# Patient Record
Sex: Female | Born: 1980 | Race: Black or African American | Hispanic: No | Marital: Married | Smoking: Never smoker
Health system: Southern US, Community
[De-identification: ages and names within clinical notes are randomized; demographics above are authoritative.]

## PROBLEM LIST (undated history)

## (undated) DIAGNOSIS — B181 Chronic viral hepatitis B without delta-agent: Secondary | ICD-10-CM

## (undated) DIAGNOSIS — O24419 Gestational diabetes mellitus in pregnancy, unspecified control: Secondary | ICD-10-CM

## (undated) DIAGNOSIS — D573 Sickle-cell trait: Secondary | ICD-10-CM

## (undated) HISTORY — DX: Gestational diabetes mellitus in pregnancy, unspecified control: O24.419

## (undated) HISTORY — DX: Chronic viral hepatitis B without delta-agent: B18.1

## (undated) HISTORY — DX: Sickle-cell trait: D57.3

---

## 2010-02-01 ENCOUNTER — Ambulatory Visit (HOSPITAL_COMMUNITY): Admission: RE | Admit: 2010-02-01 | Discharge: 2010-02-01 | Payer: Self-pay | Admitting: Obstetrics and Gynecology

## 2010-04-15 ENCOUNTER — Ambulatory Visit: Payer: Self-pay | Admitting: Obstetrics & Gynecology

## 2010-04-22 ENCOUNTER — Ambulatory Visit: Payer: Self-pay | Admitting: Obstetrics & Gynecology

## 2010-04-29 ENCOUNTER — Ambulatory Visit: Payer: Self-pay | Admitting: Family Medicine

## 2010-05-01 ENCOUNTER — Ambulatory Visit (HOSPITAL_COMMUNITY): Admission: RE | Admit: 2010-05-01 | Discharge: 2010-05-01 | Payer: Self-pay | Admitting: Family Medicine

## 2010-05-06 ENCOUNTER — Ambulatory Visit: Payer: Self-pay | Admitting: Obstetrics & Gynecology

## 2010-05-09 ENCOUNTER — Ambulatory Visit: Payer: Self-pay | Admitting: Obstetrics and Gynecology

## 2010-05-13 ENCOUNTER — Ambulatory Visit: Payer: Self-pay | Admitting: Obstetrics and Gynecology

## 2010-05-16 ENCOUNTER — Ambulatory Visit: Payer: Self-pay | Admitting: Obstetrics & Gynecology

## 2010-05-16 ENCOUNTER — Ambulatory Visit (HOSPITAL_COMMUNITY)
Admission: RE | Admit: 2010-05-16 | Discharge: 2010-05-16 | Payer: Self-pay | Source: Home / Self Care | Admitting: Family Medicine

## 2010-05-20 ENCOUNTER — Encounter: Payer: Self-pay | Admitting: Obstetrics & Gynecology

## 2010-05-20 ENCOUNTER — Ambulatory Visit: Payer: Self-pay | Admitting: Obstetrics & Gynecology

## 2010-05-20 LAB — CONVERTED CEMR LAB
ALT: 17 units/L (ref 0–35)
BUN: 5 mg/dL — ABNORMAL LOW (ref 6–23)
CO2: 21 meq/L (ref 19–32)
Creatinine, Ser: 0.68 mg/dL (ref 0.40–1.20)
Glucose, Bld: 116 mg/dL — ABNORMAL HIGH (ref 70–99)
HCT: 31.9 % — ABNORMAL LOW (ref 36.0–46.0)
MCV: 77.1 fL — ABNORMAL LOW (ref 78.0–100.0)
Platelets: 303 10*3/uL (ref 150–400)
TSH: 1.705 microintl units/mL (ref 0.350–4.500)
Total Bilirubin: 0.2 mg/dL — ABNORMAL LOW (ref 0.3–1.2)
WBC: 7.8 10*3/uL (ref 4.0–10.5)

## 2010-05-22 ENCOUNTER — Ambulatory Visit: Payer: Self-pay | Admitting: Obstetrics and Gynecology

## 2010-05-27 ENCOUNTER — Ambulatory Visit: Payer: Self-pay | Admitting: Obstetrics & Gynecology

## 2010-05-30 ENCOUNTER — Ambulatory Visit: Payer: Self-pay | Admitting: Obstetrics and Gynecology

## 2010-06-03 ENCOUNTER — Ambulatory Visit: Payer: Self-pay | Admitting: Family Medicine

## 2010-06-03 LAB — CONVERTED CEMR LAB
Chlamydia, DNA Probe: NEGATIVE
GC Probe Amp, Genital: NEGATIVE

## 2010-06-06 ENCOUNTER — Ambulatory Visit: Payer: Self-pay | Admitting: Obstetrics & Gynecology

## 2010-06-10 ENCOUNTER — Ambulatory Visit: Payer: Self-pay | Admitting: Obstetrics and Gynecology

## 2010-06-13 ENCOUNTER — Ambulatory Visit: Payer: Self-pay | Admitting: Obstetrics and Gynecology

## 2010-06-13 ENCOUNTER — Ambulatory Visit: Payer: Self-pay | Admitting: Obstetrics & Gynecology

## 2010-06-13 ENCOUNTER — Ambulatory Visit (HOSPITAL_COMMUNITY)
Admission: RE | Admit: 2010-06-13 | Discharge: 2010-06-13 | Payer: Self-pay | Source: Home / Self Care | Attending: Obstetrics and Gynecology | Admitting: Obstetrics and Gynecology

## 2010-06-14 ENCOUNTER — Ambulatory Visit: Payer: Self-pay | Admitting: Obstetrics & Gynecology

## 2010-06-16 ENCOUNTER — Inpatient Hospital Stay (HOSPITAL_COMMUNITY)
Admission: AD | Admit: 2010-06-16 | Discharge: 2010-06-18 | Payer: Self-pay | Source: Home / Self Care | Attending: Obstetrics & Gynecology | Admitting: Obstetrics & Gynecology

## 2010-06-17 ENCOUNTER — Ambulatory Visit: Payer: Self-pay | Admitting: Obstetrics and Gynecology

## 2010-06-28 ENCOUNTER — Inpatient Hospital Stay (HOSPITAL_COMMUNITY): Admission: AD | Admit: 2010-06-28 | Payer: Self-pay | Admitting: Obstetrics & Gynecology

## 2010-09-09 LAB — GLUCOSE, CAPILLARY
Glucose-Capillary: 110 mg/dL — ABNORMAL HIGH (ref 70–99)
Glucose-Capillary: 84 mg/dL (ref 70–99)

## 2010-09-09 LAB — CBC
Platelets: 338 10*3/uL (ref 150–400)
RBC: 4.42 MIL/uL (ref 3.87–5.11)
WBC: 9.1 10*3/uL (ref 4.0–10.5)

## 2010-09-09 LAB — RPR: RPR Ser Ql: NONREACTIVE

## 2010-09-10 LAB — POCT URINALYSIS DIPSTICK
Bilirubin Urine: NEGATIVE
Bilirubin Urine: NEGATIVE
Bilirubin Urine: NEGATIVE
Glucose, UA: NEGATIVE mg/dL
Glucose, UA: NEGATIVE mg/dL
Glucose, UA: NEGATIVE mg/dL
Glucose, UA: NEGATIVE mg/dL
Hgb urine dipstick: NEGATIVE
Ketones, ur: NEGATIVE mg/dL
Nitrite: NEGATIVE
Nitrite: NEGATIVE
Nitrite: NEGATIVE
Nitrite: NEGATIVE
Protein, ur: NEGATIVE mg/dL
Protein, ur: NEGATIVE mg/dL
Protein, ur: NEGATIVE mg/dL
Protein, ur: NEGATIVE mg/dL
Specific Gravity, Urine: 1.015 (ref 1.005–1.030)
Urobilinogen, UA: 0.2 mg/dL (ref 0.0–1.0)
Urobilinogen, UA: 0.2 mg/dL (ref 0.0–1.0)
Urobilinogen, UA: 0.2 mg/dL (ref 0.0–1.0)
Urobilinogen, UA: 0.2 mg/dL (ref 0.0–1.0)
Urobilinogen, UA: 0.2 mg/dL (ref 0.0–1.0)
pH: 6 (ref 5.0–8.0)
pH: 5.5 (ref 5.0–8.0)
pH: 6 (ref 5.0–8.0)

## 2010-09-11 LAB — POCT URINALYSIS DIPSTICK
Bilirubin Urine: NEGATIVE
Bilirubin Urine: NEGATIVE
Bilirubin Urine: NEGATIVE
Glucose, UA: NEGATIVE mg/dL
Glucose, UA: NEGATIVE mg/dL
Glucose, UA: NEGATIVE mg/dL
Ketones, ur: NEGATIVE mg/dL
Ketones, ur: NEGATIVE mg/dL
Nitrite: NEGATIVE
Protein, ur: NEGATIVE mg/dL
Specific Gravity, Urine: 1.015 (ref 1.005–1.030)
Specific Gravity, Urine: 1.025 (ref 1.005–1.030)
Urobilinogen, UA: 0.2 mg/dL (ref 0.0–1.0)

## 2011-12-20 IMAGING — US US OB DETAIL+14 WK
1 series · 14 of 28 positions shown · non-contrast
Comparison: none

OBSTETRICAL ULTRASOUND:
 This ultrasound exam was performed in the [HOSPITAL] Ultrasound Department.  The OB US report was generated in the AS system, and faxed to the ordering physician.  This report is also available in [HOSPITAL]?s Access[employer] and in [REDACTED] PACS.

[Series 1: us ob detail +14 wk · 14 of 41 slices shown]
[im 2/41]
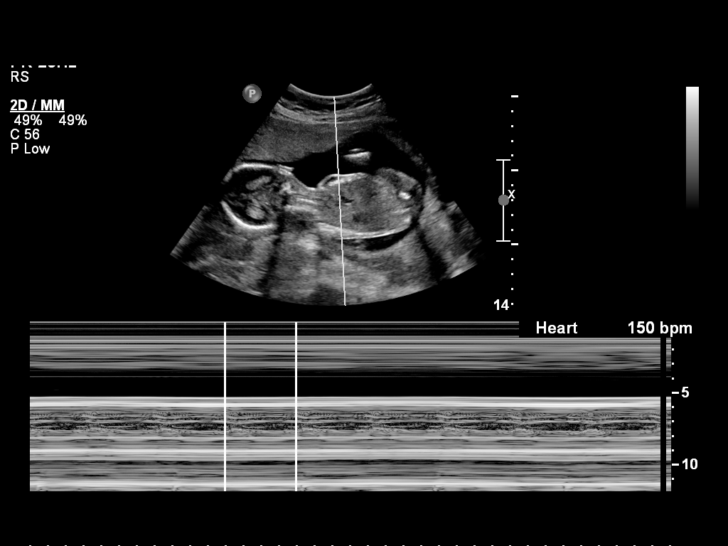
[im 5/41]
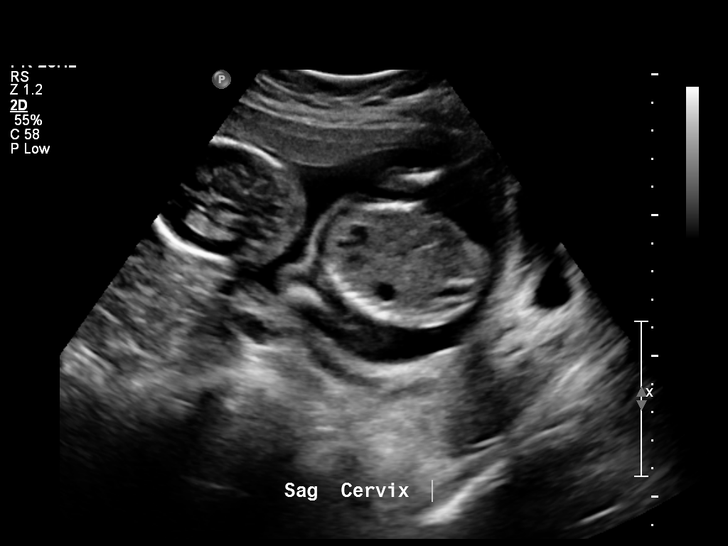
[im 8/41]
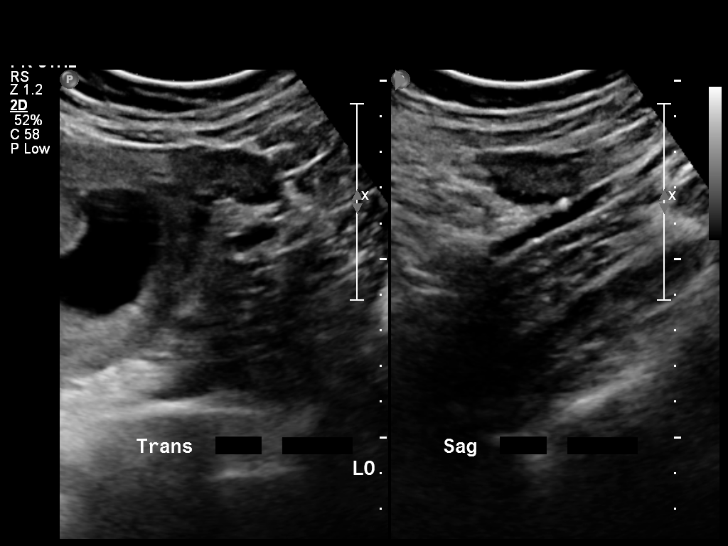
[im 11/41]
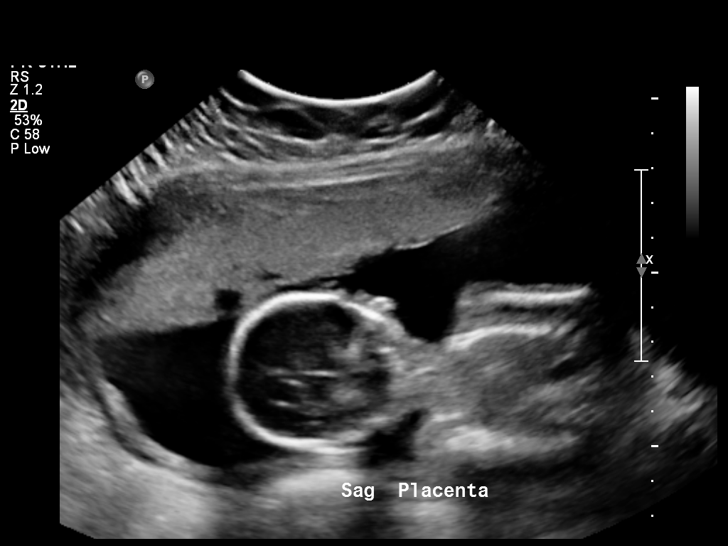
[im 14/41]
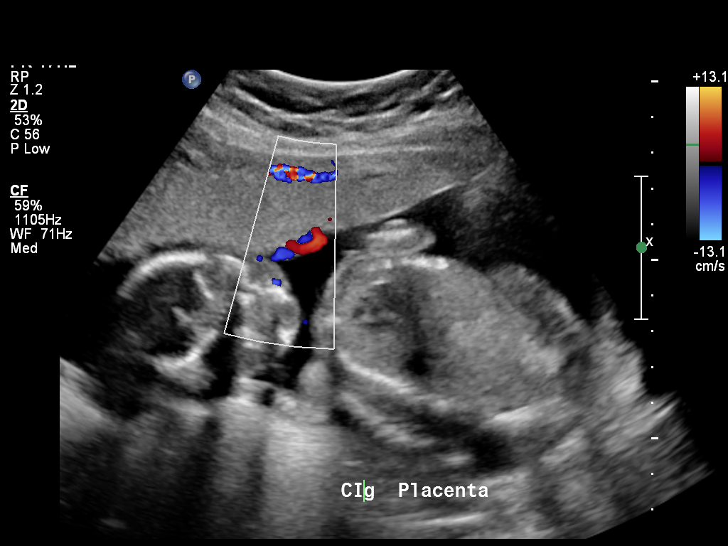
[im 17/41]
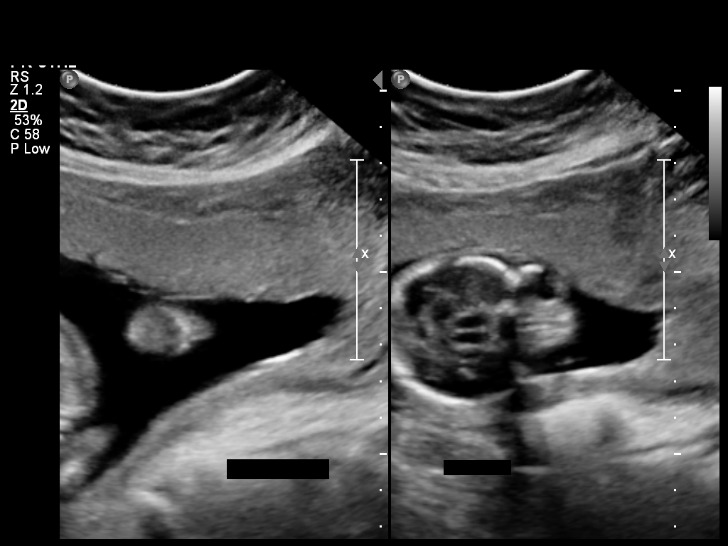
[im 20/41]
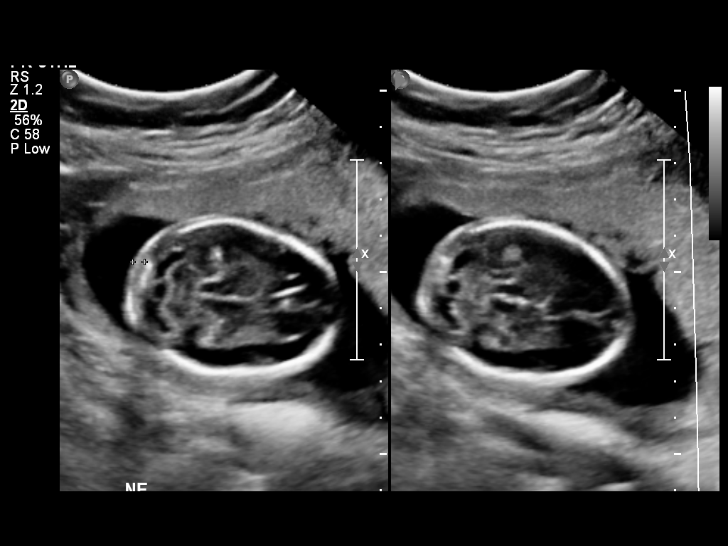
[im 23/41]
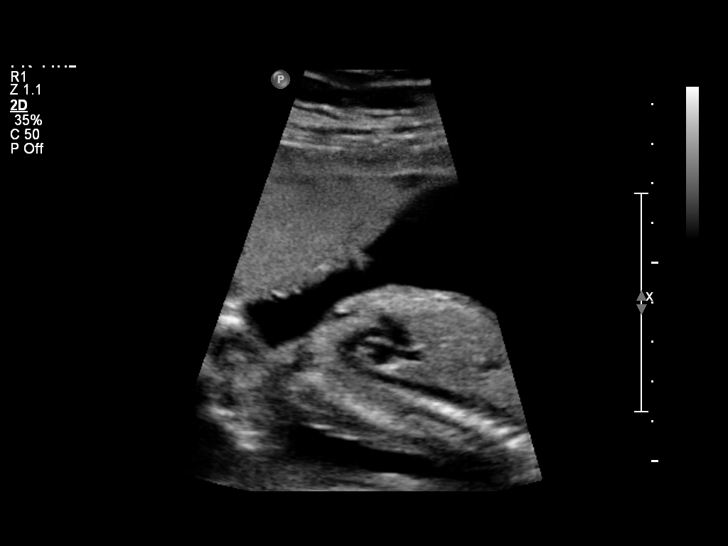
[im 26/41]
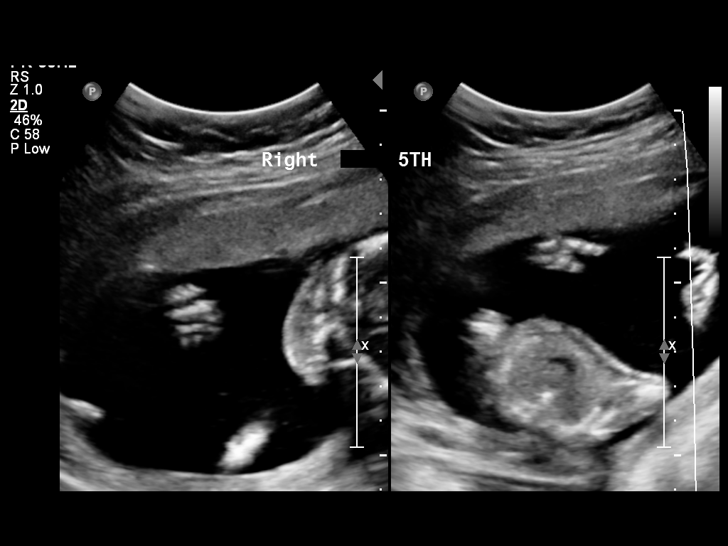
[im 29/41]
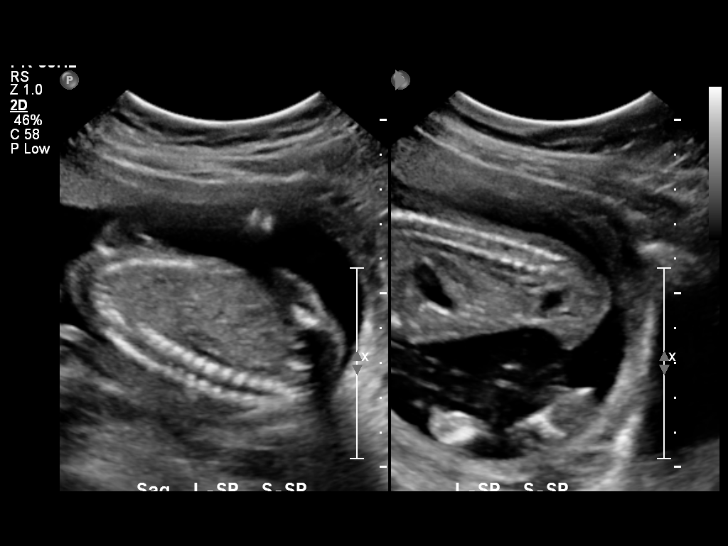
[im 32/41]
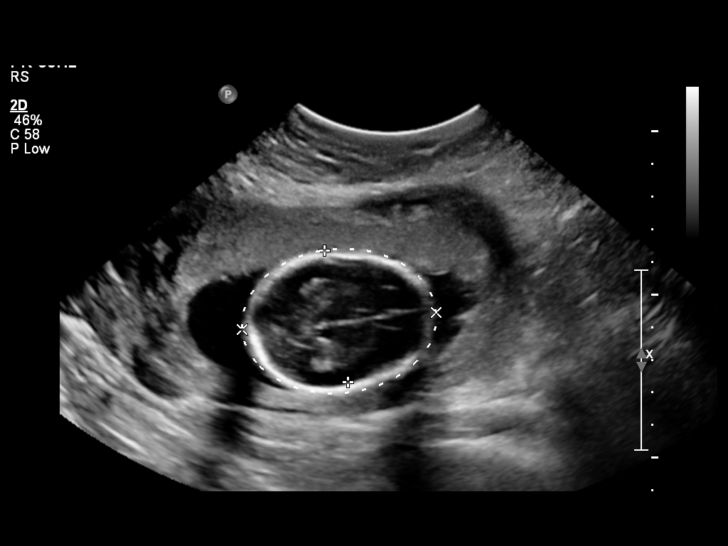
[im 35/41]
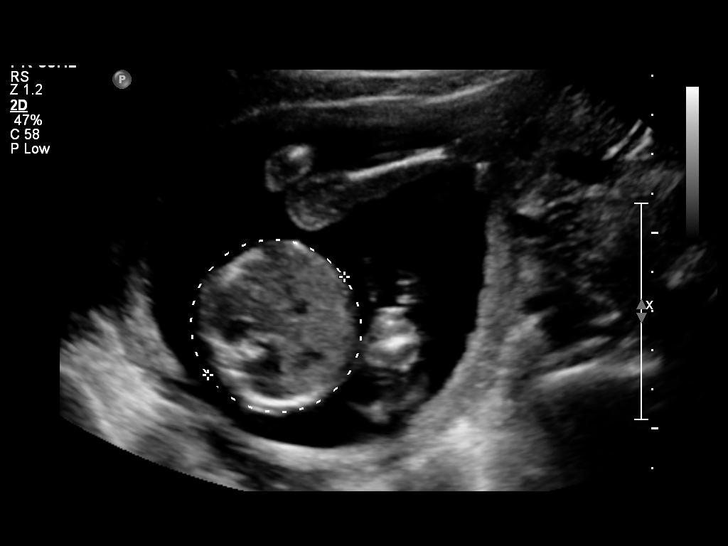
[im 38/41]
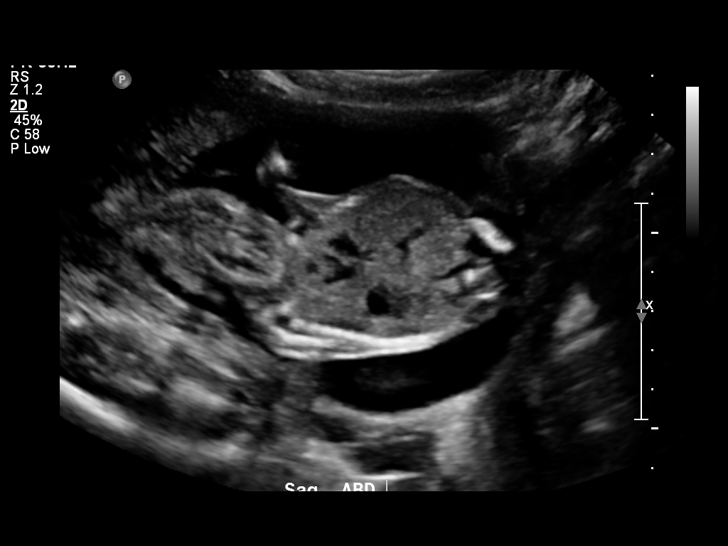
[im 41/41]
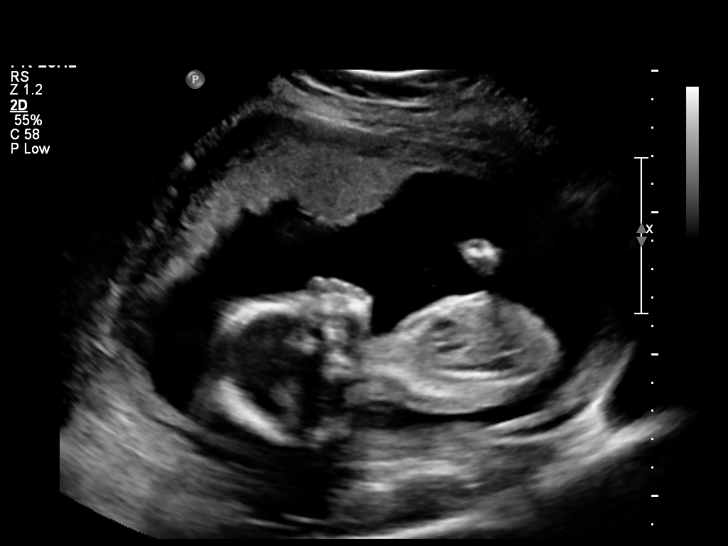

[14 of 28 positions shown; findings below may reference images not displayed]

IMPRESSION: See AS Obstetric US report.

## 2012-06-30 NOTE — L&D Delivery Note (Signed)
Delivery Note At 7:36 AM a viable and healthy female was delivered via Vaginal, Spontaneous Delivery (Presentation: Left Occiput Anterior).  APGAR: 8, ; weight pending .   Placenta status: spontaneous , intact .  Cord: 3 vessels with the following complications: None.    Anesthesia: None  Episiotomy:  Lacerations: 1st degree perineal, hemostatic, not repaired Suture Repair: none Est. Blood Loss (mL): 300  Mom to postpartum.  Baby to nursery-stable.  Tawni Carnes 01/20/2013, 7:54 AM

## 2012-11-16 ENCOUNTER — Other Ambulatory Visit: Payer: Medicaid Other

## 2012-11-16 DIAGNOSIS — D573 Sickle-cell trait: Secondary | ICD-10-CM

## 2012-11-16 DIAGNOSIS — Z3201 Encounter for pregnancy test, result positive: Secondary | ICD-10-CM

## 2012-11-16 DIAGNOSIS — O99019 Anemia complicating pregnancy, unspecified trimester: Secondary | ICD-10-CM

## 2012-11-16 DIAGNOSIS — O98419 Viral hepatitis complicating pregnancy, unspecified trimester: Secondary | ICD-10-CM

## 2012-11-17 DIAGNOSIS — O99019 Anemia complicating pregnancy, unspecified trimester: Secondary | ICD-10-CM | POA: Insufficient documentation

## 2012-11-17 LAB — OBSTETRIC PANEL
Eosinophils Relative: 2 % (ref 0–5)
Hemoglobin: 10.4 g/dL — ABNORMAL LOW (ref 12.0–15.0)
Lymphocytes Relative: 29 % (ref 12–46)
Lymphs Abs: 1.8 10*3/uL (ref 0.7–4.0)
MCH: 26.5 pg (ref 26.0–34.0)
MCV: 77.4 fL — ABNORMAL LOW (ref 78.0–100.0)
Monocytes Relative: 6 % (ref 3–12)
Neutrophils Relative %: 63 % (ref 43–77)
Platelets: 314 10*3/uL (ref 150–400)
RBC: 3.93 MIL/uL (ref 3.87–5.11)
Rh Type: POSITIVE
Rubella: 13 Index — ABNORMAL HIGH (ref <0.90)
WBC: 6.3 10*3/uL (ref 4.0–10.5)

## 2012-11-18 ENCOUNTER — Telehealth: Payer: Self-pay | Admitting: *Deleted

## 2012-11-18 LAB — HEMOGLOBINOPATHY EVALUATION
Hemoglobin Other: 0 %
Hgb S Quant: 37.3 % — ABNORMAL HIGH

## 2012-11-18 NOTE — Telephone Encounter (Signed)
Opened in error

## 2012-11-19 DIAGNOSIS — B181 Chronic viral hepatitis B without delta-agent: Secondary | ICD-10-CM | POA: Insufficient documentation

## 2012-11-19 DIAGNOSIS — D573 Sickle-cell trait: Secondary | ICD-10-CM | POA: Insufficient documentation

## 2012-11-19 HISTORY — DX: Sickle-cell trait: D57.3

## 2012-11-24 ENCOUNTER — Ambulatory Visit (INDEPENDENT_AMBULATORY_CARE_PROVIDER_SITE_OTHER): Payer: Medicaid Other | Admitting: Obstetrics and Gynecology

## 2012-11-24 ENCOUNTER — Other Ambulatory Visit (HOSPITAL_COMMUNITY)
Admission: RE | Admit: 2012-11-24 | Discharge: 2012-11-24 | Disposition: A | Discharge status: Home / Self Care | Payer: Medicaid Other | Source: Ambulatory Visit | Attending: Obstetrics and Gynecology | Admitting: Obstetrics and Gynecology

## 2012-11-24 ENCOUNTER — Encounter: Payer: Self-pay | Admitting: Obstetrics and Gynecology

## 2012-11-24 VITALS — BP 92/57 | Temp 97.0°F | Ht 63.0 in

## 2012-11-24 DIAGNOSIS — O09293 Supervision of pregnancy with other poor reproductive or obstetric history, third trimester: Secondary | ICD-10-CM | POA: Insufficient documentation

## 2012-11-24 DIAGNOSIS — Z1151 Encounter for screening for human papillomavirus (HPV): Secondary | ICD-10-CM | POA: Insufficient documentation

## 2012-11-24 DIAGNOSIS — Z01419 Encounter for gynecological examination (general) (routine) without abnormal findings: Secondary | ICD-10-CM | POA: Insufficient documentation

## 2012-11-24 DIAGNOSIS — Z113 Encounter for screening for infections with a predominantly sexual mode of transmission: Secondary | ICD-10-CM | POA: Insufficient documentation

## 2012-11-24 DIAGNOSIS — O0930 Supervision of pregnancy with insufficient antenatal care, unspecified trimester: Secondary | ICD-10-CM

## 2012-11-24 DIAGNOSIS — O0933 Supervision of pregnancy with insufficient antenatal care, third trimester: Secondary | ICD-10-CM | POA: Insufficient documentation

## 2012-11-24 DIAGNOSIS — O09299 Supervision of pregnancy with other poor reproductive or obstetric history, unspecified trimester: Secondary | ICD-10-CM

## 2012-11-24 DIAGNOSIS — O99019 Anemia complicating pregnancy, unspecified trimester: Secondary | ICD-10-CM

## 2012-11-24 LAB — POCT URINALYSIS DIP (DEVICE)
Bilirubin Urine: NEGATIVE
Glucose, UA: NEGATIVE mg/dL
Nitrite: NEGATIVE

## 2012-11-24 NOTE — Progress Notes (Signed)
   Subjective:    Suzanne Little is a [id number] 33 wks by very unsure LMP being seen today for her first obstetrical visit. NPC except MAU visit x1. No [place].  Her obstetrical history is significant for insufficient PNC and unsure dates. Patient does intend to breast feed. Pregnancy history fully reviewed.  Patient reports no complaints.  Filed Vitals:   11/24/12 0841 11/24/12 0852  BP:  92/57  Temp:  97 F (36.1 C)  Height: 5\' 3"  (1.6 m)     HISTORY: OB History   Grav Para Term Preterm Abortions TAB SAB Ect Mult Living   4 3 3  0 0 0 0 0 3      # Outc Date GA Lbr Len/2nd Wgt Sex Del Anes PTL Lv   1 TRM 7/03    F SVD      2 TRM 10/09    F SVD      3 TRM 12/11    M SVD      4 CUR              History reviewed. No pertinent past medical history. History reviewed. No pertinent past surgical history. History reviewed. No pertinent family history.   Exam    Uterus:     Pelvic Exam:    Perineum: Normal Perineum   Vulva: normal   Vagina:  normal mucosa, normal discharge       Cervix: multiparous appearance, no bleeding following Pap and no lesions   Adnexa: not evaluated   Bony Pelvis: gynecoid  System: Breast:  normal appearance, no masses or tenderness   Skin: normal coloration and turgor, no rashes    Neurologic: oriented, normal mood, grossly non-focal   Extremities: normal strength, tone, and muscle mass   HEENT PERRLA   Mouth/Teeth mucous membranes moist, pharynx normal without lesions and dental hygiene good   Neck supple and no masses   Cardiovascular: regular rate and rhythm, no murmurs or gallops   Respiratory:  appears well, vitals normal, no respiratory distress, acyanotic, normal RR, ear and throat exam is normal, neck free of mass or lymphadenopathy, chest clear, no wheezing, crepitations, rhonchi, normal symmetric air entry   Abdomen: NT, FH 32   Urinary: urethral meatus normal      Assessment:    Pregnancy: [id number] with NPC, approximately 33 wks Patient  Active Problem List   Diagnosis Date Noted  . Hepatitis B surface antigen positive, currently pregnant 11/19/2012  . Sickle cell trait 11/19/2012  . 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 Needs glucola asap. Prenatal vitamins. Add iron 1 bid. See nutrition and SS.  Problem list reviewed and updated. Genetic Screening discussed Quad Screen: too late.  Ultrasound discussed; fetal survey: ordered.  Follow up in 1 weeks. 60% of 30 min visit spent on counseling and coordination of care.  Advised to find out sickle status   Suzanne Little 11/24/2012

## 2012-11-24 NOTE — Progress Notes (Signed)
80  2nd BP: 95/66 Edema trace in feet.

## 2012-11-26 ENCOUNTER — Ambulatory Visit (HOSPITAL_COMMUNITY)
Admission: RE | Admit: 2012-11-26 | Discharge: 2012-11-26 | Disposition: A | Discharge status: Home / Self Care | Payer: Medicaid Other | Source: Ambulatory Visit | Attending: Obstetrics and Gynecology | Admitting: Obstetrics and Gynecology

## 2012-11-26 DIAGNOSIS — O09293 Supervision of pregnancy with other poor reproductive or obstetric history, third trimester: Secondary | ICD-10-CM

## 2012-11-26 DIAGNOSIS — O99019 Anemia complicating pregnancy, unspecified trimester: Secondary | ICD-10-CM

## 2012-11-26 DIAGNOSIS — Z3689 Encounter for other specified antenatal screening: Secondary | ICD-10-CM | POA: Insufficient documentation

## 2012-11-26 DIAGNOSIS — O0933 Supervision of pregnancy with insufficient antenatal care, third trimester: Secondary | ICD-10-CM

## 2012-11-26 LAB — CULTURE, OB URINE: Colony Count: 85000

## 2012-11-30 ENCOUNTER — Telehealth: Payer: Self-pay | Admitting: General Practice

## 2012-11-30 NOTE — Telephone Encounter (Signed)
Called patient with [employer] [phone] and informed patient of results/recommendations and to come tomorrow at 8 and to not eat or drink anything after midnight except water for the 3 hr test. Patient verbalized understanding to all and had no further questions

## 2012-11-30 NOTE — Telephone Encounter (Signed)
Message copied by Kathee Delton on Tue Nov 30, 2012 11:23 AM ------      Message from: POE, DEIRDRE C      Created: Fri Nov 26, 2012  5:29 PM       1 hr 177>Please schedule 3 hr ------

## 2012-12-01 ENCOUNTER — Encounter: Payer: Self-pay | Admitting: Family

## 2012-12-01 ENCOUNTER — Ambulatory Visit (INDEPENDENT_AMBULATORY_CARE_PROVIDER_SITE_OTHER): Payer: Medicaid Other | Admitting: Family

## 2012-12-01 VITALS — BP 104/70 | Wt 201.6 lb

## 2012-12-01 DIAGNOSIS — B1910 Unspecified viral hepatitis B without hepatic coma: Secondary | ICD-10-CM

## 2012-12-01 DIAGNOSIS — R7309 Other abnormal glucose: Secondary | ICD-10-CM

## 2012-12-01 DIAGNOSIS — O98519 Other viral diseases complicating pregnancy, unspecified trimester: Secondary | ICD-10-CM

## 2012-12-01 DIAGNOSIS — O99019 Anemia complicating pregnancy, unspecified trimester: Secondary | ICD-10-CM

## 2012-12-01 LAB — POCT URINALYSIS DIP (DEVICE)
Glucose, UA: NEGATIVE mg/dL
Protein, ur: NEGATIVE mg/dL
Specific Gravity, Urine: 1.015 (ref 1.005–1.030)
Urobilinogen, UA: 0.2 mg/dL (ref 0.0–1.0)
pH: 7 (ref 5.0–8.0)

## 2012-12-01 NOTE — Progress Notes (Signed)
Reviewed dating info; uncertain LMP, defer to ultrasound at 32 wk; denies vaginal itching (+fungus on pap); discussed hep B history, reports hep b + last pregnancy > obtain hepatitis panel, lft's; cbc, 3 hr, rpr today.

## 2012-12-01 NOTE — Progress Notes (Signed)
P=90, Used Equities trader

## 2012-12-02 LAB — HEPATIC FUNCTION PANEL
Alkaline Phosphatase: 93 U/L (ref 39–117)
Total Protein: 5.5 g/dL — ABNORMAL LOW (ref 6.0–8.3)

## 2012-12-02 LAB — HEPATITIS PANEL, ACUTE: Hep B C IgM: NEGATIVE

## 2012-12-02 LAB — CBC
Hemoglobin: 9.8 g/dL — ABNORMAL LOW (ref 12.0–15.0)
MCV: 78.2 fL (ref 78.0–100.0)
Platelets: 295 10*3/uL (ref 150–400)
RBC: 3.8 MIL/uL — ABNORMAL LOW (ref 3.87–5.11)
WBC: 6.7 10*3/uL (ref 4.0–10.5)

## 2012-12-02 LAB — GLUCOSE TOLERANCE, 3 HOURS
Glucose Tolerance, 1 hour: 198 mg/dL — ABNORMAL HIGH (ref 70–189)
Glucose Tolerance, 2 hour: 191 mg/dL — ABNORMAL HIGH (ref 70–164)

## 2012-12-04 ENCOUNTER — Encounter: Payer: Self-pay | Admitting: Family

## 2013-01-20 ENCOUNTER — Encounter (HOSPITAL_COMMUNITY): Payer: Self-pay | Admitting: *Deleted

## 2013-01-20 ENCOUNTER — Inpatient Hospital Stay (HOSPITAL_COMMUNITY)
Admission: AD | Admit: 2013-01-20 | Discharge: 2013-01-21 | DRG: 775 | Disposition: A | Discharge status: Home / Self Care | Payer: Medicaid Other | Source: Ambulatory Visit | Attending: Obstetrics & Gynecology | Admitting: Obstetrics & Gynecology

## 2013-01-20 DIAGNOSIS — O99814 Abnormal glucose complicating childbirth: Secondary | ICD-10-CM

## 2013-01-20 DIAGNOSIS — IMO0001 Reserved for inherently not codable concepts without codable children: Secondary | ICD-10-CM

## 2013-01-20 LAB — CBC
HCT: 30.1 % — ABNORMAL LOW (ref 36.0–46.0)
Hemoglobin: 10.4 g/dL — ABNORMAL LOW (ref 12.0–15.0)
MCHC: 34.6 g/dL (ref 30.0–36.0)
RBC: 3.95 MIL/uL (ref 3.87–5.11)
WBC: 9.1 10*3/uL (ref 4.0–10.5)

## 2013-01-20 LAB — POCT FERN TEST: POCT Fern Test: POSITIVE

## 2013-01-20 LAB — GROUP B STREP BY PCR: Group B strep by PCR: NEGATIVE

## 2013-01-20 LAB — OB RESULTS CONSOLE GBS: GBS: NEGATIVE

## 2013-01-20 LAB — RPR: RPR Ser Ql: NONREACTIVE

## 2013-01-20 MED ORDER — OXYTOCIN 40 UNITS IN LACTATED RINGERS INFUSION - SIMPLE MED
62.5000 mL/h | INTRAVENOUS | Status: DC
  Filled 2013-01-20: qty 1000

## 2013-01-20 MED ORDER — SENNOSIDES-DOCUSATE SODIUM 8.6-50 MG PO TABS
2.0000 | ORAL_TABLET | Freq: Every day | ORAL | Status: DC
  Administered 2013-01-21: 2 via ORAL

## 2013-01-20 MED ORDER — ONDANSETRON HCL 4 MG PO TABS: 4.0000 mg | ORAL_TABLET | ORAL | Status: DC | PRN

## 2013-01-20 MED ORDER — PRENATAL MULTIVITAMIN CH
1.0000 | ORAL_TABLET | Freq: Every day | ORAL | Status: DC
  Administered 2013-01-20 – 2013-01-21 (×2): 1 via ORAL
  Filled 2013-01-20 (×2): qty 1

## 2013-01-20 MED ORDER — IBUPROFEN 600 MG PO TABS
600.0000 mg | ORAL_TABLET | Freq: Four times a day (QID) | ORAL | Status: DC
  Administered 2013-01-20 – 2013-01-21 (×5): 600 mg via ORAL
  Filled 2013-01-20 (×5): qty 1

## 2013-01-20 MED ORDER — ONDANSETRON HCL 4 MG/2ML IJ SOLN: 4.0000 mg | Freq: Four times a day (QID) | INTRAMUSCULAR | Status: DC | PRN

## 2013-01-20 MED ORDER — TETANUS-DIPHTH-ACELL PERTUSSIS 5-2.5-18.5 LF-MCG/0.5 IM SUSP
0.5000 mL | Freq: Once | INTRAMUSCULAR | Status: AC
  Administered 2013-01-21: 0.5 mL via INTRAMUSCULAR
  Filled 2013-01-20: qty 0.5

## 2013-01-20 MED ORDER — WITCH HAZEL-GLYCERIN EX PADS: 1.0000 "application " | MEDICATED_PAD | CUTANEOUS | Status: DC | PRN

## 2013-01-20 MED ORDER — LIDOCAINE HCL (PF) 1 % IJ SOLN
30.0000 mL | INTRAMUSCULAR | Status: DC | PRN
  Filled 2013-01-20 (×3): qty 30

## 2013-01-20 MED ORDER — OXYTOCIN BOLUS FROM INFUSION
500.0000 mL | INTRAVENOUS | Status: DC
  Administered 2013-01-20: 500 mL via INTRAVENOUS

## 2013-01-20 MED ORDER — OXYCODONE-ACETAMINOPHEN 5-325 MG PO TABS: 1.0000 | ORAL_TABLET | ORAL | Status: DC | PRN

## 2013-01-20 MED ORDER — BENZOCAINE-MENTHOL 20-0.5 % EX AERO: 1.0000 "application " | INHALATION_SPRAY | CUTANEOUS | Status: DC | PRN

## 2013-01-20 MED ORDER — DIBUCAINE 1 % RE OINT: 1.0000 "application " | TOPICAL_OINTMENT | RECTAL | Status: DC | PRN

## 2013-01-20 MED ORDER — ONDANSETRON HCL 4 MG/2ML IJ SOLN: 4.0000 mg | INTRAMUSCULAR | Status: DC | PRN

## 2013-01-20 MED ORDER — ZOLPIDEM TARTRATE 5 MG PO TABS: 5.0000 mg | ORAL_TABLET | Freq: Every evening | ORAL | Status: DC | PRN

## 2013-01-20 MED ORDER — CITRIC ACID-SODIUM CITRATE 334-500 MG/5ML PO SOLN: 30.0000 mL | ORAL | Status: DC | PRN

## 2013-01-20 MED ORDER — LACTATED RINGERS IV SOLN
INTRAVENOUS | Status: DC
  Administered 2013-01-20: 06:00:00 via INTRAVENOUS

## 2013-01-20 MED ORDER — SIMETHICONE 80 MG PO CHEW: 80.0000 mg | CHEWABLE_TABLET | ORAL | Status: DC | PRN

## 2013-01-20 MED ORDER — DIPHENHYDRAMINE HCL 25 MG PO CAPS: 25.0000 mg | ORAL_CAPSULE | Freq: Four times a day (QID) | ORAL | Status: DC | PRN

## 2013-01-20 MED ORDER — LACTATED RINGERS IV SOLN: 500.0000 mL | INTRAVENOUS | Status: DC | PRN

## 2013-01-20 MED ORDER — ACETAMINOPHEN 325 MG PO TABS: 650.0000 mg | ORAL_TABLET | ORAL | Status: DC | PRN

## 2013-01-20 MED ORDER — IBUPROFEN 600 MG PO TABS: 600.0000 mg | ORAL_TABLET | Freq: Four times a day (QID) | ORAL | Status: DC | PRN

## 2013-01-20 MED ORDER — LANOLIN HYDROUS EX OINT: TOPICAL_OINTMENT | CUTANEOUS | Status: DC | PRN

## 2013-01-20 NOTE — Progress Notes (Signed)
UR completed 

## 2013-01-20 NOTE — MAU Note (Signed)
Pt reports LOF since 1600 7/23

## 2013-01-20 NOTE — Progress Notes (Signed)
forebag ruptured

## 2013-01-20 NOTE — H&P (Signed)
Suzanne Little is a 32 y.o. female presenting for SROM and active labor. Maternal Medical History:  Reason for admission: Rupture of membranes and contractions.  Nausea.  Contractions: Onset was 1-2 hours ago.   Frequency: regular.   Duration is approximately 1 minute.   Perceived severity is moderate.    Fetal activity: Perceived fetal activity is normal.   Last perceived fetal movement was within the past hour.    Prenatal Complications - Diabetes: gestational. Diabetes is managed by oral agent (monotherapy).     HPI: 32 y.o. G4P3000 at [redacted]w[redacted]d (by 32w U/S) presents for rupture of membranes and contractions. Felt a gush of fluid at 3am, contractions started at 4am and are increasing in strength, q5 minutes. No bleeding. +FM. Denies chest pain, shortness of breath, fevers/chills, headache, dizziness, changes in vision.   Prenatal course: Poor follow up at [facility] - Normal anatomy on 32w U/S - GDMA2, prescribed glyburide  OB History   Grav Para Term Preterm Abortions TAB SAB Ect Mult Living   4 3 3  0 0 0 0 0 3     Previous SVD  No past medical history on file. No past surgical history on file. Family History: family history is not on file. Social History:  reports that she has never smoked. She has never used smokeless tobacco. She reports that she does not drink alcohol or use illicit drugs.   Prenatal Transfer Tool  Maternal Diabetes: Yes:  Diabetes Type:  Insulin/Medication controlled Genetic Screening: Declined Maternal Ultrasounds/Referrals: Normal Fetal Ultrasounds or other Referrals:  None Maternal Substance Abuse:  No Significant Maternal Medications:  Meds include: Other:  Significant Maternal Lab Results:  Lab values include: HBsAG positive Other Comments:  poor prenatal care follow up  Review of Systems  Constitutional: Negative for fever and chills.  Eyes: Negative for blurred vision and double vision.  Respiratory: Negative for shortness of breath.    Cardiovascular: Negative for chest pain.  Gastrointestinal: Negative for heartburn, nausea and vomiting.  Neurological: Negative for dizziness and headaches.    Dilation: 7.5 Effacement (%): 80 Station: -2 Exam by:: Lucy Chris RNC/Dr. Waynetta Sandy Blood pressure 126/72, pulse 109, temperature 97.2 F (36.2 C), temperature source Oral, resp. rate 18, last menstrual period 04/05/2012. Maternal Exam:  Uterine Assessment: Contraction strength is moderate.  Contraction frequency is regular.   Abdomen: Patient reports no abdominal tenderness. Fetal presentation: vertex  Introitus: Ferning test: positive.   Pelvis: adequate for delivery.   Cervix: Cervix evaluated by digital exam.     Fetal Exam Fetal Monitor Review: Baseline rate: 135.  Variability: moderate (6-25 bpm).   Pattern: no accelerations and no decelerations.    Fetal State Assessment: Category II - tracings are indeterminate.     Physical Exam  Constitutional: She is oriented to person, place, and time. She appears well-developed and well-nourished.  HENT:  Head: Normocephalic and atraumatic.  Cardiovascular: Normal rate, regular rhythm, normal heart sounds and intact distal pulses.  Exam reveals no gallop and no friction rub.   No murmur heard. Respiratory: Effort normal and breath sounds normal.  GI:  Gravid, nontender  Neurological: She is alert and oriented to person, place, and time.  Skin: Skin is warm and dry.    Prenatal labs: ABO, Rh: B/POS/-- (05/20 1050) Antibody: NEG (05/20 1050) Rubella: 13.00 (05/20 1050) RPR: NON REAC (06/04 1222)  HBsAg: POSITIVE (06/04 1222)  HIV: NON REACTIVE (05/20 1050)  GBS:     Assessment/Plan: 32 y.o. G4P3000 [redacted]w[redacted]d  SROM, cervix 7.5cm,  contracting Active labor  GBS PCR ordered HBsAg positive Does not want epidural or IV pain meds Normal L&D orders Expectant management for SVD  Tawni Carnes 01/20/2013, 5:02 AM  I have seen and examined this patient and I agree  with the above. Pt is A2GDM- no recent scan noted. GBS negative. Cam Hai 11:37 PM 01/25/2013

## 2013-01-20 NOTE — Progress Notes (Signed)
Suzanne Little is a 32 y.o. G4P3000 at [redacted]w[redacted]d   Subjective: Breathing with ctx  Objective: BP 125/93  Pulse 92  Temp(Src) 97.9 F (36.6 C) (Oral)  Resp 16  Ht 5\' 3"  (1.6 m)  Wt 91.173 kg (201 lb)  BMI 35.61 kg/m2  LMP 04/05/2012      FHT:  FHR: 135-140 bpm, variability: minimal ,  accelerations:  Present,  decelerations:  Absent 10x10 accels present; occ mi variables UC:   regular, every 3-4 minutes SVE:   Dilation: 9 Effacement (%): 80 Station: 0 Exam by:: Pincus Badder CNM intact BOW  Labs: Lab Results  Component Value Date   WBC 9.1 01/20/2013   HGB 10.4* 01/20/2013   HCT 30.1* 01/20/2013   MCV 76.2* 01/20/2013   PLT 319 01/20/2013    Assessment / Plan: Active labor  Anticipate SVD  Cam Hai 01/20/2013, 7:21 AM

## 2013-01-21 ENCOUNTER — Encounter: Payer: Self-pay | Admitting: *Deleted

## 2013-01-21 DIAGNOSIS — O0933 Supervision of pregnancy with insufficient antenatal care, third trimester: Secondary | ICD-10-CM

## 2013-01-21 LAB — GLUCOSE, CAPILLARY: Glucose-Capillary: 76 mg/dL (ref 70–99)

## 2013-01-21 MED ORDER — IBUPROFEN 600 MG PO TABS: 600.0000 mg | ORAL_TABLET | Freq: Four times a day (QID) | ORAL | Status: DC

## 2013-01-21 NOTE — Progress Notes (Signed)
Post Partum Day 1   Subjective: no complaints, up ad lib, voiding, tolerating PO and + flatus  Objective: Blood pressure 89/59, pulse 75, temperature 98.6 F (37 C), temperature source Oral, resp. rate 18, height 5\' 3"  (1.6 m), weight 201 lb (91.173 kg), last menstrual period 04/05/2012, SpO2 100.00%, unknown if currently breastfeeding.  Physical Exam:  General: alert, cooperative, appears stated age and no distress Lochia: appropriate Uterine Fundus: firm at U-1 Incision: NA DVT Evaluation: No evidence of DVT seen on physical exam. Negative Homan's sign. No cords or calf tenderness. No significant calf/ankle edema.   Recent Labs  01/20/13 0540  HGB 10.4*  HCT 30.1*    Assessment/Plan: Plan for discharge tomorrow, Breastfeeding, Lactation consult and Contraception desires tubal. Did not have epidural with delivery. Unable to find consent. Will discuss at rounds GDMA2: previously on glyburide. REpeat FSG today.  LOS: 1 day   Suzanne Little, RYAN 01/21/2013, 7:37 AM

## 2013-01-21 NOTE — Discharge Summary (Cosign Needed)
Obstetric Discharge Summary Reason for Admission: onset of labor Prenatal Procedures: none Intrapartum Procedures: spontaneous vaginal delivery Postpartum Procedures: none Complications-Operative and Postpartum: none Hemoglobin  Date Value Range Status  01/20/2013 10.4* 12.0 - 15.0 g/dL Final     HCT  Date Value Range Status  01/20/2013 30.1* 36.0 - 46.0 % Final    Physical Exam:  General: alert, cooperative and no distress Lochia: appropriate Uterine Fundus: firm @U -1  Incision: No incision DVT Evaluation: No evidence of DVT seen on physical exam. Negative Homan's sign. No cords or calf tenderness. No significant calf/ankle edema.  Discharge Diagnoses: Term Pregnancy-delivered  Discharge Information: Date: 01/21/2013 Activity: unrestricted, pelvic rest and 6wks Diet: routine Medications: PNV and Ibuprofen Condition: stable Instructions: refer to practice specific booklet Discharge to: home Follow-up Information   Follow up with [facility] Schedule an appointment as soon as possible for a visit in 3 weeks. (call to schedule appt to set up tubal)    Contact information:   [address] [place] [address] [phone]      Newborn Data: Live born female  Birth Weight: 6 lb 13.3 oz (3099 g) APGAR: 8, 9  Home with mother.  Tawana Scale 01/21/2013, 9:33 AM

## 2013-01-21 NOTE — Progress Notes (Signed)
Clinical Social Work Department BRIEF PSYCHOSOCIAL ASSESSMENT 01/21/2013  Patient:  Suzanne Little,Suzanne Little     Account Number:  [id number]     Admit date:  01/20/2013  Clinical Social Worker:  Effrey Davidow, LCSW  Date/Time:  01/21/2013 12:00 N  Referred by:  RN  Date Referred:  01/21/2013 Referred for  Other - See comment   Other Referral:   LPNC at 33 weeks   Interview type:  Patient Other interview type:   chart review    PSYCHOSOCIAL DATA Living Status:  FAMILY Admitted from facility:   Level of care:   Primary support name:  Koffi Wodome Primary support relationship to patient:  SPOUSE Degree of support available:   Good    CURRENT CONCERNS Current Concerns  None Noted   Other Concerns:   LPNC    SOCIAL WORK ASSESSMENT / PLAN CSW met with MOB in her first floor room to complete assessment for LPNC at 33 weeks.  CSW asked MOB if she would like CSW to get a French interpreter, but MOB stated she speaks English and does not need an interpreter.  CSW ensured that she has transportation to pediatrician appointments and informed MOB of hospital drug screen policy.  MOB was completely appropriate and understanding. She denies drug use and states no questions or needs at this time.   Assessment/plan status:  No Further Intervention Required Other assessment/ plan:   Information/referral to community resources:   No referral needs noted    PATIENT'S/FAMILY'S RESPONSE TO PLAN OF CARE: MOB was pleasant and states she was not able to get Medicaid until the end of her pregnancy because her husband traveled and she needed his "payment" information for the Medicaid application.  She states no issues with transportation and denies drug use.  She was understanding of the drug screen policy and states no concerns.    

## 2013-04-18 ENCOUNTER — Ambulatory Visit (INDEPENDENT_AMBULATORY_CARE_PROVIDER_SITE_OTHER): Payer: Medicaid Other | Admitting: Medical

## 2013-04-18 ENCOUNTER — Encounter: Payer: Self-pay | Admitting: Medical

## 2013-04-18 MED ORDER — NORETHINDRONE 0.35 MG PO TABS: 1.0000 | ORAL_TABLET | Freq: Every day | ORAL | Status: DC

## 2013-04-18 NOTE — Progress Notes (Signed)
Here for postpartum visit. Denies feeling sad,or depressed, or anxious or thoughts of hurting self.

## 2013-04-18 NOTE — Patient Instructions (Signed)

## 2013-04-18 NOTE — Progress Notes (Signed)
Patient ID: Suzanne Little, female   DOB: 01-29-1981, 32 y.o.   MRN: [id number] Subjective:     Suzanne Little is a 32 y.o. female who presents for a postpartum visit. She is 13 weeks postpartum following a spontaneous vaginal delivery. I have fully reviewed the prenatal and intrapartum course. The delivery was at 39.6 gestational weeks. Outcome: spontaneous vaginal delivery. Anesthesia: none. Postpartum course has been normal. Baby's course has been normal. Baby is feeding by both breast and bottle - Gerber Soy Formula. Bleeding no bleeding. Bowel function is normal. Bladder function is normal. Patient is sexually active. Last intercourse was 2 weeks ago. Contraception method is none. Postpartum depression screening: negative.  The following portions of the patient's history were reviewed and updated as appropriate: allergies, current medications, past family history, past medical history, past social history, past surgical history and problem list.  Review of Systems Pertinent items are noted in HPI.   Objective:    BP 123/85  Pulse 88  Temp(Src) 98.5 F (36.9 C)  Ht 5' 3.5" (1.613 m)  Wt 202 lb (91.627 kg)  BMI 35.22 kg/m2  Breastfeeding? Yes  General:  alert   Breasts:  not performed  Lungs: clear to auscultation bilaterally  Heart:  regular rate and rhythm, S1, S2 normal, no murmur, click, rub or gallop  Abdomen: soft, non-tender; bowel sounds normal; no masses,  no organomegaly and positive bowel sounds in all four quadrants   Vulva:  normal  Vagina: normal vagina, no discharge, exudate, lesion, or erythema  Cervix:  normal contour  Corpus: normal size, contour, position, consistency, mobility, non-tender  Adnexa:  normal adnexa and no mass, fullness, tenderness  Rectal Exam: Not performed.        UPT - Negative Assessment:     Normal postpartum exam. Pap smear not done at today's visit.    Plan:    1. Contraception: OCPs. Rx for Micronor sent to patient's pharmacy with R2.  2.  Patient to schedule for Nexplanon insertion in 4-6 weeks 3. Patient is due for annual exam 10/2013. Next pap will be due in 2017.  4. Follow up in: May 2015 for annual exam or as needed.

## 2013-06-15 ENCOUNTER — Encounter: Payer: Self-pay | Admitting: Obstetrics & Gynecology

## 2013-06-15 ENCOUNTER — Encounter (INDEPENDENT_AMBULATORY_CARE_PROVIDER_SITE_OTHER): Payer: Medicaid Other | Admitting: Obstetrics & Gynecology

## 2013-06-15 NOTE — Progress Notes (Signed)
Pt. Came in today in hopes of getting the nexplanon. Explained to patient that because she her Medicaid expired yesterday and she does not have insurance today, that the nexplanon would cost her over $1,000 today out of pocket. Pt. Verbalized understanding and explained that that would not be doable. Gave pt. Information and pamphlets about all birth control options, discussed the Mirena and the application for free Mirena if she would like it. Pt. Was unsure. Informed pt. That she can think options over, if she gets insurance and would like to still do the nexplanon she could call and make an appointment to come back. If she decides what she wants and insurance is still an issue she can go to the health department to obtain the Mirena or Nexplanon. Pt. Verbalized understanding and stated she would go to the health department.

## 2013-06-15 NOTE — Progress Notes (Signed)
   Subjective:    Patient ID: Suzanne Little, female    DOB: [birth year], 32 y.o.   MRN: [id number]  HPI  Erroneous visit  Review of Systems     Objective:   Physical Exam        Assessment & Plan:

## 2013-12-25 ENCOUNTER — Other Ambulatory Visit: Payer: Self-pay | Admitting: Medical

## 2014-01-06 ENCOUNTER — Telehealth: Payer: Self-pay

## 2014-01-06 DIAGNOSIS — Z3049 Encounter for surveillance of other contraceptives: Secondary | ICD-10-CM

## 2014-01-06 MED ORDER — NORETHINDRONE 0.35 MG PO TABS: 1.0000 | ORAL_TABLET | Freq: Every day | ORAL | Status: DC

## 2014-01-06 NOTE — Telephone Encounter (Signed)
Patient here today stating she needs refill of OCP. Reports last pill was last Sunday. Last episode of unprotected sex was Sunday night.  Informed patient I could give her three refills and that she must use back up protection for the entire first pack. Advised she call in September to make annual appointment. Pt. Verbalized understanding. No further questions or concerns.

## 2014-05-01 ENCOUNTER — Encounter: Payer: Self-pay | Admitting: Obstetrics & Gynecology

## 2014-05-11 ENCOUNTER — Encounter: Payer: Self-pay | Admitting: Obstetrics & Gynecology

## 2014-05-11 ENCOUNTER — Ambulatory Visit (INDEPENDENT_AMBULATORY_CARE_PROVIDER_SITE_OTHER): Payer: Self-pay | Admitting: Obstetrics & Gynecology

## 2014-05-11 VITALS — BP 129/110 | HR 113 | Temp 97.9°F | Wt 223.8 lb

## 2014-05-11 DIAGNOSIS — Z3041 Encounter for surveillance of contraceptive pills: Secondary | ICD-10-CM

## 2014-05-11 MED ORDER — NORETHINDRONE 0.35 MG PO TABS: 1.0000 | ORAL_TABLET | Freq: Every day | ORAL | Status: DC

## 2014-05-11 NOTE — Progress Notes (Signed)
Interpreter Amina Tahirou present

## 2014-05-11 NOTE — Progress Notes (Signed)
Subjective:     Patient ID: Suzanne Little, female   DOB: 12/24/1980, 33 y.o.   MRN: [id number]  HPI [id number] Patient's last menstrual period was 04/27/2014 (approximate). Regular menses, nursing 7415 mo old, wants to stay on Micronor Scheduled Meds: Continuous Infusions: PRN Meds:.   History reviewed. No pertinent past surgical history. History reviewed. No pertinent past medical history.]  Review of Systems  Constitutional: Negative.   Gastrointestinal: Negative for nausea and abdominal distention.  Genitourinary: Negative for vaginal discharge, menstrual problem and pelvic pain.       Objective:   Physical Exam  Constitutional: She is oriented to person, place, and time. She appears well-developed. No distress.  Neurological: She is alert and oriented to person, place, and time.  Skin: Skin is warm and dry.  Psychiatric: She has a normal mood and affect. Her behavior is normal.       Assessment:     Doing well on POP, will continue. Pap is up to date and not done today     Plan:     Micronor Rx renewed.   Adam PhenixJames G Rylan Bernard, MD 05/11/2014

## 2014-05-11 NOTE — Patient Instructions (Signed)
Breastfeeding Deciding to breastfeed is one of the best choices you can make for you and your baby. A change in hormones during pregnancy causes your breast tissue to grow and increases the number and size of your milk ducts. These hormones also allow proteins, sugars, and fats from your blood supply to make breast milk in your milk-producing glands. Hormones prevent breast milk from being released before your baby is born as well as prompt milk flow after birth. Once breastfeeding has begun, thoughts of your baby, as well as his or her sucking or crying, can stimulate the release of milk from your milk-producing glands.  BENEFITS OF BREASTFEEDING For Your Baby  Your first milk (colostrum) helps your baby's digestive system function better.   There are antibodies in your milk that help your baby fight off infections.   Your baby has a lower incidence of asthma, allergies, and sudden infant death syndrome.   The nutrients in breast milk are better for your baby than infant formulas and are designed uniquely for your baby's needs.   Breast milk improves your baby's brain development.   Your baby is less likely to develop other conditions, such as childhood obesity, asthma, or type 2 diabetes mellitus.  For You   Breastfeeding helps to create a very special bond between you and your baby.   Breastfeeding is convenient. Breast milk is always available at the correct temperature and costs nothing.   Breastfeeding helps to burn calories and helps you lose the weight gained during pregnancy.   Breastfeeding makes your uterus contract to its prepregnancy size faster and slows bleeding (lochia) after you give birth.   Breastfeeding helps to lower your risk of developing type 2 diabetes mellitus, osteoporosis, and breast or ovarian cancer later in life. SIGNS THAT YOUR BABY IS HUNGRY Early Signs of Hunger  Increased alertness or activity.  Stretching.  Movement of the head from  side to side.  Movement of the head and opening of the mouth when the corner of the mouth or cheek is stroked (rooting).  Increased sucking sounds, smacking lips, cooing, sighing, or squeaking.  Hand-to-mouth movements.  Increased sucking of fingers or hands. Late Signs of Hunger  Fussing.  Intermittent crying. Extreme Signs of Hunger Signs of extreme hunger will require calming and consoling before your baby will be able to breastfeed successfully. Do not wait for the following signs of extreme hunger to occur before you initiate breastfeeding:   Restlessness.  A loud, strong cry.   Screaming. BREASTFEEDING BASICS Breastfeeding Initiation  Find a comfortable place to sit or lie down, with your neck and back well supported.  Place a pillow or rolled up blanket under your baby to bring him or her to the level of your breast (if you are seated). Nursing pillows are specially designed to help support your arms and your baby while you breastfeed.  Make sure that your baby's abdomen is facing your abdomen.   Gently massage your breast. With your fingertips, massage from your chest wall toward your nipple in a circular motion. This encourages milk flow. You may need to continue this action during the feeding if your milk flows slowly.  Support your breast with 4 fingers underneath and your thumb above your nipple. Make sure your fingers are well away from your nipple and your baby's mouth.   Stroke your baby's lips gently with your finger or nipple.   When your baby's mouth is open wide enough, quickly bring your baby to your   breast, placing your entire nipple and as much of the colored area around your nipple (areola) as possible into your baby's mouth.   More areola should be visible above your baby's upper lip than below the lower lip.   Your baby's tongue should be between his or her lower gum and your breast.   Ensure that your baby's mouth is correctly positioned  around your nipple (latched). Your baby's lips should create a seal on your breast and be turned out (everted).  It is common for your baby to suck about 2-3 minutes in order to start the flow of breast milk. Latching Teaching your baby how to latch on to your breast properly is very important. An improper latch can cause nipple pain and decreased milk supply for you and poor weight gain in your baby. Also, if your baby is not latched onto your nipple properly, he or she may swallow some air during feeding. This can make your baby fussy. Burping your baby when you switch breasts during the feeding can help to get rid of the air. However, teaching your baby to latch on properly is still the best way to prevent fussiness from swallowing air while breastfeeding. Signs that your baby has successfully latched on to your nipple:    Silent tugging or silent sucking, without causing you pain.   Swallowing heard between every 3-4 sucks.    Muscle movement above and in front of his or her ears while sucking.  Signs that your baby has not successfully latched on to nipple:   Sucking sounds or smacking sounds from your baby while breastfeeding.  Nipple pain. If you think your baby has not latched on correctly, slip your finger into the corner of your baby's mouth to break the suction and place it between your baby's gums. Attempt breastfeeding initiation again. Signs of Successful Breastfeeding Signs from your baby:   A gradual decrease in the number of sucks or complete cessation of sucking.   Falling asleep.   Relaxation of his or her body.   Retention of a small amount of milk in his or her mouth.   Letting go of your breast by himself or herself. Signs from you:  Breasts that have increased in firmness, weight, and size 1-3 hours after feeding.   Breasts that are softer immediately after breastfeeding.  Increased milk volume, as well as a change in milk consistency and color by  the fifth day of breastfeeding.   Nipples that are not sore, cracked, or bleeding. Signs That Your Baby is Getting Enough Milk  Wetting at least 3 diapers in a 24-hour period. The urine should be clear and pale yellow by age 5 days.  At least 3 stools in a 24-hour period by age 5 days. The stool should be soft and yellow.  At least 3 stools in a 24-hour period by age 7 days. The stool should be seedy and yellow.  No loss of weight greater than 10% of birth weight during the first 3 days of age.  Average weight gain of 4-7 ounces (113-198 g) per week after age 4 days.  Consistent daily weight gain by age 5 days, without weight loss after the age of 2 weeks. After a feeding, your baby may spit up a small amount. This is common. BREASTFEEDING FREQUENCY AND DURATION Frequent feeding will help you make more milk and can prevent sore nipples and breast engorgement. Breastfeed when you feel the need to reduce the fullness of your breasts   or when your baby shows signs of hunger. This is called "breastfeeding on demand." Avoid introducing a pacifier to your baby while you are working to establish breastfeeding (the first 4-6 weeks after your baby is born). After this time you may choose to use a pacifier. Research has shown that pacifier use during the first year of a baby's life decreases the risk of sudden infant death syndrome (SIDS). Allow your baby to feed on each breast as long as he or she wants. Breastfeed until your baby is finished feeding. When your baby unlatches or falls asleep while feeding from the first breast, offer the second breast. Because newborns are often sleepy in the first few weeks of life, you may need to awaken your baby to get him or her to feed. Breastfeeding times will vary from baby to baby. However, the following rules can serve as a guide to help you ensure that your baby is properly fed:  Newborns (babies 4 weeks of age or younger) may breastfeed every 1-3  hours.  Newborns should not go longer than 3 hours during the day or 5 hours during the night without breastfeeding.  You should breastfeed your baby a minimum of 8 times in a 24-hour period until you begin to introduce solid foods to your baby at around 6 months of age. BREAST MILK PUMPING Pumping and storing breast milk allows you to ensure that your baby is exclusively fed your breast milk, even at times when you are unable to breastfeed. This is especially important if you are going back to work while you are still breastfeeding or when you are not able to be present during feedings. Your lactation consultant can give you guidelines on how long it is safe to store breast milk.  A breast pump is a machine that allows you to pump milk from your breast into a sterile bottle. The pumped breast milk can then be stored in a refrigerator or freezer. Some breast pumps are operated by hand, while others use electricity. Ask your lactation consultant which type will work best for you. Breast pumps can be purchased, but some hospitals and breastfeeding support groups lease breast pumps on a monthly basis. A lactation consultant can teach you how to hand express breast milk, if you prefer not to use a pump.  CARING FOR YOUR BREASTS WHILE YOU BREASTFEED Nipples can become dry, cracked, and sore while breastfeeding. The following recommendations can help keep your breasts moisturized and healthy:  Avoid using soap on your nipples.   Wear a supportive bra. Although not required, special nursing bras and tank tops are designed to allow access to your breasts for breastfeeding without taking off your entire bra or top. Avoid wearing underwire-style bras or extremely tight bras.  Air dry your nipples for 3-4minutes after each feeding.   Use only cotton bra pads to absorb leaked breast milk. Leaking of breast milk between feedings is normal.   Use lanolin on your nipples after breastfeeding. Lanolin helps to  maintain your skin's normal moisture barrier. If you use pure lanolin, you do not need to wash it off before feeding your baby again. Pure lanolin is not toxic to your baby. You may also hand express a few drops of breast milk and gently massage that milk into your nipples and allow the milk to air dry. In the first few weeks after giving birth, some women experience extremely full breasts (engorgement). Engorgement can make your breasts feel heavy, warm, and tender to the   touch. Engorgement peaks within 3-5 days after you give birth. The following recommendations can help ease engorgement:  Completely empty your breasts while breastfeeding or pumping. You may want to start by applying warm, moist heat (in the shower or with warm water-soaked hand towels) just before feeding or pumping. This increases circulation and helps the milk flow. If your baby does not completely empty your breasts while breastfeeding, pump any extra milk after he or she is finished.  Wear a snug bra (nursing or regular) or tank top for 1-2 days to signal your body to slightly decrease milk production.  Apply ice packs to your breasts, unless this is too uncomfortable for you.  Make sure that your baby is latched on and positioned properly while breastfeeding. If engorgement persists after 48 hours of following these recommendations, contact your health care provider or a lactation consultant. OVERALL HEALTH CARE RECOMMENDATIONS WHILE BREASTFEEDING  Eat healthy foods. Alternate between meals and snacks, eating 3 of each per day. Because what you eat affects your breast milk, some of the foods may make your baby more irritable than usual. Avoid eating these foods if you are sure that they are negatively affecting your baby.  Drink milk, fruit juice, and water to satisfy your thirst (about 10 glasses a day).   Rest often, relax, and continue to take your prenatal vitamins to prevent fatigue, stress, and anemia.  Continue  breast self-awareness checks.  Avoid chewing and smoking tobacco.  Avoid alcohol and drug use. Some medicines that may be harmful to your baby can pass through breast milk. It is important to ask your health care provider before taking any medicine, including all over-the-counter and prescription medicine as well as vitamin and herbal supplements. It is possible to become pregnant while breastfeeding. If birth control is desired, ask your health care provider about options that will be safe for your baby. SEEK MEDICAL CARE IF:   You feel like you want to stop breastfeeding or have become frustrated with breastfeeding.  You have painful breasts or nipples.  Your nipples are cracked or bleeding.  Your breasts are red, tender, or warm.  You have a swollen area on either breast.  You have a fever or chills.  You have nausea or vomiting.  You have drainage other than breast milk from your nipples.  Your breasts do not become full before feedings by the fifth day after you give birth.  You feel sad and depressed.  Your baby is too sleepy to eat well.  Your baby is having trouble sleeping.   Your baby is wetting less than 3 diapers in a 24-hour period.  Your baby has less than 3 stools in a 24-hour period.  Your baby's skin or the white part of his or her eyes becomes yellow.   Your baby is not gaining weight by 5 days of age. SEEK IMMEDIATE MEDICAL CARE IF:   Your baby is overly tired (lethargic) and does not want to wake up and feed.  Your baby develops an unexplained fever. Document Released: 06/16/2005 Document Revised: 06/21/2013 Document Reviewed: 12/08/2012 [employer] Patient Information 2015 [employer]. This information is not intended to replace advice given to you by your health care provider. Make sure you discuss any questions you have with your health care provider.  

## 2014-11-23 ENCOUNTER — Emergency Department (HOSPITAL_COMMUNITY)
Admission: EM | Admit: 2014-11-23 | Discharge: 2014-11-23 | Disposition: A | Discharge status: Home / Self Care | Payer: Self-pay | Attending: Emergency Medicine | Admitting: Emergency Medicine

## 2014-11-23 ENCOUNTER — Encounter (HOSPITAL_COMMUNITY): Payer: Self-pay | Admitting: Emergency Medicine

## 2014-11-23 DIAGNOSIS — J020 Streptococcal pharyngitis: Secondary | ICD-10-CM | POA: Insufficient documentation

## 2014-11-23 LAB — RAPID STREP SCREEN (MED CTR MEBANE ONLY): Streptococcus, Group A Screen (Direct): NEGATIVE

## 2014-11-23 MED ORDER — PENICILLIN G BENZATHINE 1200000 UNIT/2ML IM SUSP
1.2000 10*6.[IU] | Freq: Once | INTRAMUSCULAR | Status: AC
  Administered 2014-11-23: 1.2 10*6.[IU] via INTRAMUSCULAR
  Filled 2014-11-23: qty 2

## 2014-11-23 NOTE — ED Notes (Signed)
C/o cough, nasal congestion and pain, sore throat and headache.

## 2014-11-23 NOTE — ED Notes (Signed)
Pt reports cough, sore throat since yesterday. sts she vomited yesterday from choking on phlegm. Pt c.o headache and swelling to R side of face.

## 2014-11-23 NOTE — ED Provider Notes (Signed)
CSN: [id number]     Arrival date & time 11/23/14  1227 History  This chart was scribed for non-physician practitioner, Emilia BeckKaitlyn Vaanya Shambaugh, PA-C, working with Jerelyn ScottMartha Linker, MD, by Lionel DecemberHatice Demirci, ED Scribe. This patient was seen in room TR08C/TR08C and the patient's care was started at 2:00 PM.    Chief Complaint  Patient presents with  . Sore Throat  . Cough     (Consider location/radiation/quality/duration/timing/severity/associated sxs/prior Treatment) HPI  HPI Comments: Suzanne Little is a 34 y.o. female who presents to the Emergency Department complaining of a sore throat and productive cough onset yesterday.  Patient has associated symptoms of myalgia, fever and a headache.  She has taken ibuprofen at home but denies any significant relief.   Her children are now at home sick as well.  She has no other questions or concerns today.   History reviewed. No pertinent past medical history. History reviewed. No pertinent past surgical history. No family history on file. History  Substance Use Topics  . Smoking status: Never Smoker   . Smokeless tobacco: Not on file  . Alcohol Use: No   OB History    No data available     Review of Systems  HENT: Positive for congestion and sore throat.   Respiratory: Positive for cough. Negative for choking and wheezing.   Gastrointestinal: Negative for nausea, diarrhea and constipation.  Neurological: Positive for headaches.      Allergies  Review of patient's allergies indicates no known allergies.  Home Medications   Prior to Admission medications   Not on File   BP 126/83 mmHg  Pulse 88  Temp(Src) 99.1 F (37.3 C) (Oral)  Resp 18  Ht 5\' 3"  (1.6 m)  Wt 222 lb 4.8 oz (100.835 kg)  BMI 39.39 kg/m2  SpO2 96%  LMP 11/06/2014 Physical Exam  Constitutional: She is oriented to person, place, and time. She appears well-developed and well-nourished. No distress.  HENT:  Head: Normocephalic and atraumatic.  Posterior pharynx  erythematous with tonsillar edema and exudate.  Bilateral Tm's intact without erythema or bulging.   Eyes: Conjunctivae and EOM are normal.  Neck: Neck supple. No tracheal deviation present.  Cardiovascular: Normal rate.   Pulmonary/Chest: Effort normal. No respiratory distress.  Abdominal: Soft. She exhibits no distension. There is no tenderness. There is no rebound.  Musculoskeletal: Normal range of motion.  Lymphadenopathy:    She has cervical adenopathy.  Neurological: She is alert and oriented to person, place, and time.  Skin: Skin is warm and dry.  Psychiatric: She has a normal mood and affect. Her behavior is normal.  Nursing note and vitals reviewed.   ED Course  Procedures (including critical care time) DIAGNOSTIC STUDIES: Oxygen Saturation is 96% on RA, normal by my interpretation.    COORDINATION OF CARE: 2:03 PM Discussed treatment plan with patient at beside, the patient agrees with the plan and has no further questions at this time.   Labs Review Labs Reviewed  RAPID STRE[facility] SCREEN (NOT [facility])    Imaging Review No results found.   EKG Interpretation None      MDM   Final diagnoses:  Strep throat   2:55 PM Patient's rapid strep negative. Patient will be treated for strep throat based on clinical appearance. Patient has a low grade temp with other vitals stable.   I personally performed the services described in this documentation, which was scribed in my presence. The recorded information has been reviewed and is accurate.  Emilia Beck, PA-C 11/23/14 1456  Jerelyn Scott, MD 11/23/14 1517

## 2014-11-24 ENCOUNTER — Encounter: Payer: Self-pay | Admitting: Obstetrics & Gynecology

## 2014-11-26 LAB — CULTURE, GROUP A STREP: Strep A Culture: POSITIVE — AB

## 2014-11-27 ENCOUNTER — Telehealth (HOSPITAL_COMMUNITY): Payer: Self-pay

## 2014-11-27 NOTE — Telephone Encounter (Signed)
Post ED Visit - Positive Culture Follow-up  Culture report reviewed by antimicrobial stewardship pharmacist: []  Wes Dulaney, Pharm.D., BCPS []  Celedonio MiyamotoJeremy Frens, [address] BCPS []  Georgina PillionElizabeth Martin, [address] BCPS []  Rock SpringMinh Pham, [address] BCPS, AAHIVP []  Estella HuskMichelle Turner, Pharm.D., BCPS, AAHIVP []  Elder CyphersLorie Poole, [address] BCPS X Rachel Rumbarger Pharm D  Positive Group A Strep  culture Treated with bicillin LA, organism sensitive to the same and no further patient follow-up is required at this time.  Ashley JacobsFesterman, Eulis Salazar C 11/27/2014, 3:31 PM

## 2015-05-22 ENCOUNTER — Other Ambulatory Visit: Payer: Self-pay | Admitting: Obstetrics & Gynecology

## 2015-05-25 ENCOUNTER — Other Ambulatory Visit: Payer: Self-pay | Admitting: Obstetrics & Gynecology

## 2015-09-11 ENCOUNTER — Other Ambulatory Visit: Payer: Self-pay | Admitting: Obstetrics & Gynecology

## 2015-11-11 ENCOUNTER — Other Ambulatory Visit: Payer: Self-pay | Admitting: Obstetrics & Gynecology

## 2016-01-08 ENCOUNTER — Other Ambulatory Visit: Payer: Self-pay | Admitting: Obstetrics & Gynecology

## 2016-02-20 ENCOUNTER — Ambulatory Visit: Payer: Self-pay | Admitting: Obstetrics & Gynecology

## 2016-05-18 ENCOUNTER — Other Ambulatory Visit: Payer: Self-pay | Admitting: Obstetrics & Gynecology

## 2016-06-02 ENCOUNTER — Ambulatory Visit (INDEPENDENT_AMBULATORY_CARE_PROVIDER_SITE_OTHER): Payer: Self-pay | Admitting: Obstetrics & Gynecology

## 2016-06-02 ENCOUNTER — Encounter: Payer: Self-pay | Admitting: Obstetrics & Gynecology

## 2016-06-02 VITALS — BP 128/77 | HR 78 | Ht 64.0 in | Wt 222.3 lb

## 2016-06-02 DIAGNOSIS — Z30011 Encounter for initial prescription of contraceptive pills: Secondary | ICD-10-CM

## 2016-06-02 DIAGNOSIS — Z01419 Encounter for gynecological examination (general) (routine) without abnormal findings: Secondary | ICD-10-CM

## 2016-06-02 MED ORDER — NORGESTIM-ETH ESTRAD TRIPHASIC 0.18/0.215/0.25 MG-25 MCG PO TABS: 1.0000 | ORAL_TABLET | Freq: Every day | ORAL | 11 refills | Status: DC

## 2016-06-02 NOTE — Progress Notes (Signed)
Patient ID: Suzanne Little, female   DOB: 09/10/1980, 35 y.o.   MRN: [id number]  Chief Complaint  Patient presents with  . Gynecologic Exam    HPI Suzanne Little is a 35 y.o. female.  [id number] Patient's last menstrual period was 05/18/2016. Take POP and considering change HPI  History reviewed. No pertinent past medical history.  History reviewed. No pertinent surgical history.  History reviewed. No pertinent family history.  Social History Social History  Substance Use Topics  . Smoking status: Never Smoker  . Smokeless tobacco: Never Used  . Alcohol use No    No Known Allergies  Current Outpatient Prescriptions  Medication Sig Dispense Refill  . ibuprofen (ADVIL,MOTRIN) 600 MG tablet Take 1 tablet (600 mg total) by mouth every 6 (six) hours. 30 tablet 0  . NORLYDA 0.35 MG tablet TAKE 1 TABLET BY MOUTH EVERY DAY 84 tablet 0  . Norgestimate-Ethinyl Estradiol Triphasic 0.18/0.215/0.25 MG-25 MCG tab Take 1 tablet by mouth daily. 1 Package 11   No current facility-administered medications for this visit.     Review of Systems Review of Systems  Constitutional: Negative.   HENT: Negative.   Respiratory: Positive for cough.   Gastrointestinal: Negative.   Genitourinary: Negative.     Blood pressure 128/77, pulse 78, height 5\' 4"  (1.626 m), weight 222 lb 4.8 oz (100.8 kg), last menstrual period 05/18/2016, currently breastfeeding.  Physical Exam Physical Exam  Constitutional: She is oriented to person, place, and time. She appears well-developed. No distress.  Cardiovascular: Normal rate and normal heart sounds.   Pulmonary/Chest: Effort normal and breath sounds normal.  Abdominal: Soft. She exhibits no distension. There is no tenderness.  Genitourinary:  Genitourinary Comments: deferred  Neurological: She is alert and oriented to person, place, and time.  Skin: Skin is warm and dry.  Psychiatric: She has a normal mood and affect. Her behavior is normal.  Vitals  reviewed.   Data Reviewed Pap 2014 nl neg HPV  Assessment    Patient Active Problem List   Diagnosis Date Noted  . Oral contraceptive use 05/11/2014  . Sickle cell trait (HCC) 11/19/2012   Well woman exam    Plan    Pap at screening clinic this year [facility]       Alilah Mcmeans 06/02/2016, 9:20 AM

## 2016-06-02 NOTE — Progress Notes (Signed)
States here for annual. /Declines interpreter.  States thinks birth control pill makes her sleepy. Is self pay elects to have annual exam without pap today, would like to go to free pap screening for pap. Phone number given.

## 2016-06-02 NOTE — Patient Instructions (Signed)
Oral Contraception Information Oral contraceptive pills (OCPs) are medicines taken to prevent pregnancy. OCPs work by preventing the ovaries from releasing eggs. The hormones in OCPs also cause the cervical mucus to thicken, preventing the sperm from entering the uterus. The hormones also cause the uterine lining to become thin, not allowing a fertilized egg to attach to the inside of the uterus. OCPs are highly effective when taken exactly as prescribed. However, OCPs do not prevent sexually transmitted diseases (STDs). Safe sex practices, such as using condoms along with the pill, can help prevent STDs.  Before taking the pill, you may have a physical exam and Pap test. Your health care provider may order blood tests. The health care provider will make sure you are a good candidate for oral contraception. Discuss with your health care provider the possible side effects of the OCP you may be prescribed. When starting an OCP, it can take 2 to 3 months for the body to adjust to the changes in hormone levels in your body.  TYPES OF ORAL CONTRACEPTION  The combination pill-This pill contains estrogen and progestin (synthetic progesterone) hormones. The combination pill comes in 21-day, 28-day, or 91-day packs. Some types of combination pills are meant to be taken continuously (365-day pills). With 21-day packs, you do not take pills for 7 days after the last pill. With 28-day packs, the pill is taken every day. The last 7 pills are without hormones. Certain types of pills have more than 21 hormone-containing pills. With 91-day packs, the first 84 pills contain both hormones, and the last 7 pills contain no hormones or contain estrogen only.  The minipill-This pill contains the progesterone hormone only. The pill is taken every day continuously. It is very important to take the pill at the same time each day. The minipill comes in packs of 28 pills. All 28 pills contain the hormone.  ADVANTAGES OF ORAL  CONTRACEPTIVE PILLS  Decreases premenstrual symptoms.   Treats menstrual period cramps.   Regulates the menstrual cycle.   Decreases a heavy menstrual flow.   May treatacne, depending on the type of pill.   Treats abnormal uterine bleeding.   Treats polycystic ovarian syndrome.   Treats endometriosis.   Can be used as emergency contraception.  THINGS THAT CAN MAKE ORAL CONTRACEPTIVE PILLS LESS EFFECTIVE OCPs can be less effective if:   You forget to take the pill at the same time every day.   You have a stomach or intestinal disease that lessens the absorption of the pill.   You take OCPs with other medicines that make OCPs less effective, such as antibiotics, certain HIV medicines, and some seizure medicines.   You take expired OCPs.   You forget to restart the pill on day 7, when using the packs of 21 pills.  RISKS ASSOCIATED WITH ORAL CONTRACEPTIVE PILLS  Oral contraceptive pills can sometimes cause side effects, such as:  Headache.  Nausea.  Breast tenderness.  Irregular bleeding or spotting. Combination pills are also associated with a small increased risk of:  Blood clots.  Heart attack.  Stroke. This information is not intended to replace advice given to you by your health care provider. Make sure you discuss any questions you have with your health care provider. Document Released: 09/06/2002 Document Revised: 10/08/2015 Document Reviewed: 12/05/2012 [employer] Interactive Patient Education  2017 [employer]  

## 2017-06-16 ENCOUNTER — Other Ambulatory Visit: Payer: Self-pay | Admitting: Obstetrics & Gynecology

## 2017-06-16 DIAGNOSIS — Z01419 Encounter for gynecological examination (general) (routine) without abnormal findings: Secondary | ICD-10-CM

## 2017-06-30 NOTE — L&D Delivery Note (Signed)
Patient: Krista BlueKanle Shackleford MRN: [id number]  GBS status: Negative  Patient is a 37 y.o. now G5P5 s/p NSVD at 9542w5d, who was admitted for SOL. SROM 1h 2043m prior to delivery with clear fluid.    Delivery Note At 12:38 PM a viable female was delivered via Vaginal, Spontaneous (Presentation: LOA ).  APGAR: 9 ,9 ; weight pending .   Placenta status: spontaneous , intact .  Cord: 3 vessel with the following complications: loose nuchal x1, true knot   Anesthesia:  None  Episiotomy:  N/A  Lacerations:  1st degree perineal, hemostatic  Suture Repair: N/A Est. Blood Loss (mL):  200   Head delivered LOA. Loose nuchal cord present, reduced after delivery. Shoulder and body delivered in usual fashion. Infant with spontaneous cry, placed on mother's abdomen, dried and bulb suctioned. Cord clamped x 2 after 1-minute delay, and cut by family member. Cord blood drawn. Placenta delivered spontaneously with gentle cord traction. Fundus firm with massage and Pitocin. Perineum inspected and found to have 1st degree perineal laceration, which was found to be hemostatic.  Mom to postpartum.  Baby to Couplet care / Skin to Skin.  Delivery performed by Rhett BannisterLaurel Wallace, DO and supervised by Lyndel SafeKimberly Newton, MD. I was present for the entire delivery.  De HollingsheadCatherine L Wallace 02/03/2018, 1:00 PM

## 2017-08-24 ENCOUNTER — Other Ambulatory Visit: Payer: Self-pay

## 2017-09-02 LAB — CYTOLOGY - PAP: Diagnosis: NEGATIVE

## 2017-09-03 ENCOUNTER — Encounter: Payer: Self-pay | Admitting: *Deleted

## 2017-09-30 ENCOUNTER — Encounter: Payer: Self-pay | Admitting: *Deleted

## 2017-10-01 ENCOUNTER — Encounter: Payer: Self-pay | Admitting: Obstetrics & Gynecology

## 2017-10-28 ENCOUNTER — Other Ambulatory Visit (HOSPITAL_COMMUNITY)
Admission: RE | Admit: 2017-10-28 | Discharge: 2017-10-28 | Disposition: A | Discharge status: Home / Self Care | Payer: Medicaid Other | Source: Ambulatory Visit | Attending: Family Medicine | Admitting: Family Medicine

## 2017-10-28 ENCOUNTER — Ambulatory Visit (INDEPENDENT_AMBULATORY_CARE_PROVIDER_SITE_OTHER): Payer: Medicaid Other | Admitting: Family Medicine

## 2017-10-28 ENCOUNTER — Encounter: Payer: Self-pay | Admitting: Family Medicine

## 2017-10-28 VITALS — BP 121/78 | HR 106 | Wt 226.0 lb

## 2017-10-28 DIAGNOSIS — Z3687 Encounter for antenatal screening for uncertain dates: Secondary | ICD-10-CM | POA: Diagnosis not present

## 2017-10-28 DIAGNOSIS — O09292 Supervision of pregnancy with other poor reproductive or obstetric history, second trimester: Secondary | ICD-10-CM

## 2017-10-28 DIAGNOSIS — O0992 Supervision of high risk pregnancy, unspecified, second trimester: Secondary | ICD-10-CM | POA: Diagnosis not present

## 2017-10-28 DIAGNOSIS — Z3A00 Weeks of gestation of pregnancy not specified: Secondary | ICD-10-CM | POA: Insufficient documentation

## 2017-10-28 DIAGNOSIS — O09529 Supervision of elderly multigravida, unspecified trimester: Secondary | ICD-10-CM | POA: Insufficient documentation

## 2017-10-28 DIAGNOSIS — O98412 Viral hepatitis complicating pregnancy, second trimester: Secondary | ICD-10-CM

## 2017-10-28 DIAGNOSIS — O0990 Supervision of high risk pregnancy, unspecified, unspecified trimester: Secondary | ICD-10-CM | POA: Insufficient documentation

## 2017-10-28 DIAGNOSIS — O09522 Supervision of elderly multigravida, second trimester: Secondary | ICD-10-CM

## 2017-10-28 DIAGNOSIS — O98419 Viral hepatitis complicating pregnancy, unspecified trimester: Secondary | ICD-10-CM

## 2017-10-28 DIAGNOSIS — Z8632 Personal history of gestational diabetes: Secondary | ICD-10-CM

## 2017-10-28 DIAGNOSIS — O0930 Supervision of pregnancy with insufficient antenatal care, unspecified trimester: Secondary | ICD-10-CM | POA: Insufficient documentation

## 2017-10-28 DIAGNOSIS — B1910 Unspecified viral hepatitis B without hepatic coma: Secondary | ICD-10-CM

## 2017-10-28 DIAGNOSIS — O09299 Supervision of pregnancy with other poor reproductive or obstetric history, unspecified trimester: Secondary | ICD-10-CM

## 2017-10-28 DIAGNOSIS — O0932 Supervision of pregnancy with insufficient antenatal care, second trimester: Secondary | ICD-10-CM

## 2017-10-28 NOTE — Progress Notes (Signed)
DATING AND VIABILITY SONOGRAM   Suzanne Little is a 37 y.o. year old G42P4004 with LMP Patient's last menstrual period was 05/29/2017. which would correlate to  [redacted]w[redacted]d weeks gestation.  She has irregular menstrual cycles.   She is here today for a confirmatory initial sonogram.    GESTATION: [place]     FETAL ACTIVITY:          Heart rate: 145bpm          The fetus is active.  GESTATIONAL AGE AND  BIOMETRICS:  Gestational criteria: Estimated Date of Delivery: 02/16/18 by midtrimester ultrasound now at [redacted]w[redacted]d  Previous Scans:0 HC 22.5cm 24.3wks  FL 4.25cm 23.6wks                                                   AVERAGE EGA(BY THIS SCAN):  24.1 weeks  WORKING EDD( midtrimester ultrasound ): 02/16/2018   SLIUP measuring 24.1 wks by AUA (FL/HC) with FHR 145bpm

## 2017-10-28 NOTE — Progress Notes (Signed)
   PRENATAL VISIT NOTE  Subjective:  Suzanne Little is a 37 y.o. Z6X0960 at [redacted]w[redacted]d being seen today for ongoing prenatal care.  She is currently monitored for the following issues for this high-risk pregnancy and has Sickle cell trait (HCC); Supervision of high risk pregnancy, antepartum; Advanced maternal age in multigravida; Late prenatal care affecting pregnancy; and History of gestational diabetes in prior pregnancy, currently pregnant on their problem list.  Patient reports no complaints.  Contractions: Not present. Vag. Bleeding: None.  Movement: Present. Denies leaking of fluid.   The following portions of the patient's history were reviewed and updated as appropriate: allergies, current medications, past family history, past medical history, past social history, past surgical history and problem list. Problem list updated.  Objective:   Vitals:   10/28/17 1316  BP: 121/78  Pulse: (!) 106  Weight: 226 lb (102.5 kg)    Fetal Status: Fetal Heart Rate (bpm): 145   Movement: Present     General:  Alert, oriented and cooperative. Patient is in no acute distress.  Skin: Skin is warm and dry. No rash noted.   Cardiovascular: Normal heart rate noted  Respiratory: Normal respiratory effort, no problems with respiration noted  Abdomen: Soft, gravid, appropriate for gestational age.  Pain/Pressure: Absent     Pelvic: Cervical exam deferred        Extremities: Normal range of motion.  Edema: None  Mental Status: Normal mood and affect. Normal behavior. Normal judgment and thought content.   Assessment and Plan:  Pregnancy: G5P4004 at [redacted]w[redacted]d  1. Supervision of high risk pregnancy, antepartum - Obstetric Panel, Including HIV - GC/Chlamydia probe amp ([place])not at [facility] - Genetic Screening - Cystic Fibrosis Mutation 97 - SMN1 COPY NUMBER ANALYSIS (SMA Carrier Screen) - Hemoglobin A1c - Comprehensive metabolic panel - Protein / creatinine ratio, urine - [place] bedside; Future - [place] MFM  OB COMP + 14 WK; Future  2. Antepartum multigravida of advanced maternal age NIPTs today  3. Late prenatal care affecting pregnancy in second trimester  4. History of gestational diabetes in prior pregnancy, currently pregnant Has A2GDM in 2014 pregnancy but never took glyburide (only had 2 visits before SROM/Delivery)   Preterm labor symptoms and general obstetric precautions including but not limited to vaginal bleeding, contractions, leaking of fluid and fetal movement were reviewed in detail with the patient. Please refer to After Visit Summary for other counseling recommendations.  Return in about 1 month (around 11/25/2017) for Routine prenatal care.  No future appointments.  Federico Flake, MD

## 2017-10-29 LAB — OBSTETRIC PANEL, INCLUDING HIV
ANTIBODY SCREEN: NEGATIVE
Basophils Absolute: 0 10*3/uL (ref 0.0–0.2)
Basos: 0 %
EOS (ABSOLUTE): 0.1 10*3/uL (ref 0.0–0.4)
EOS: 1 %
HEMATOCRIT: 30.6 % — AB (ref 34.0–46.6)
HEMOGLOBIN: 9.9 g/dL — AB (ref 11.1–15.9)
HIV SCREEN 4TH GENERATION: NONREACTIVE
IMMATURE GRANS (ABS): 0 10*3/uL (ref 0.0–0.1)
Immature Granulocytes: 0 %
LYMPHS ABS: 1.5 10*3/uL (ref 0.7–3.1)
LYMPHS: 18 %
MCH: 26.4 pg — AB (ref 26.6–33.0)
MCHC: 32.4 g/dL (ref 31.5–35.7)
MCV: 82 fL (ref 79–97)
MONOS ABS: 0.6 10*3/uL (ref 0.1–0.9)
Monocytes: 7 %
NEUTROS ABS: 6 10*3/uL (ref 1.4–7.0)
Neutrophils: 74 %
Platelets: 307 10*3/uL (ref 150–379)
RBC: 3.75 x10E6/uL — AB (ref 3.77–5.28)
RDW: 15.3 % (ref 12.3–15.4)
RH TYPE: POSITIVE
RPR Ser Ql: NONREACTIVE
Rubella Antibodies, IGG: 10.2 index (ref >0.99)
WBC: 8.2 10*3/uL (ref 3.4–10.8)

## 2017-10-29 LAB — COMPREHENSIVE METABOLIC PANEL
A/G RATIO: 1.2 (ref 1.2–2.2)
ALBUMIN: 3.2 g/dL — AB (ref 3.5–5.5)
ALT: 16 IU/L (ref 0–32)
AST: 28 IU/L (ref 0–40)
Alkaline Phosphatase: 74 IU/L (ref 39–117)
BUN / CREAT RATIO: 8 — AB (ref 9–23)
BUN: 6 mg/dL (ref 6–20)
Bilirubin Total: <0.2 mg/dL (ref 0.0–1.2)
CALCIUM: 8.7 mg/dL (ref 8.7–10.2)
CO2: 21 mmol/L (ref 20–29)
Chloride: 102 mmol/L (ref 96–106)
Creatinine, Ser: 0.78 mg/dL (ref 0.57–1.00)
GFR, EST AFRICAN AMERICAN: 114 mL/min/{1.73_m2} (ref >59)
GFR, EST NON AFRICAN AMERICAN: 99 mL/min/{1.73_m2} (ref >59)
Globulin, Total: 2.6 g/dL (ref 1.5–4.5)
Glucose: 131 mg/dL — ABNORMAL HIGH (ref 65–99)
POTASSIUM: 3.8 mmol/L (ref 3.5–5.2)
Sodium: 136 mmol/L (ref 134–144)
TOTAL PROTEIN: 5.8 g/dL — AB (ref 6.0–8.5)

## 2017-10-29 LAB — GC/CHLAMYDIA PROBE AMP (~~LOC~~) NOT AT ARMC
CHLAMYDIA, DNA PROBE: NEGATIVE
Neisseria Gonorrhea: NEGATIVE

## 2017-10-29 LAB — HEPATITIS B SURFACE AG, CONFIRM: HBSAG CONFIRMATION: POSITIVE — AB

## 2017-10-29 LAB — PROTEIN / CREATININE RATIO, URINE
Creatinine, Urine: 169.4 mg/dL
PROTEIN/CREAT RATIO: 83 mg/g{creat} (ref 0–200)
Protein, Ur: 14.1 mg/dL

## 2017-10-29 LAB — HEMOGLOBIN A1C
Est. average glucose Bld gHb Est-mCnc: 117 mg/dL
Hgb A1c MFr Bld: 5.7 % — ABNORMAL HIGH (ref 4.8–5.6)

## 2017-11-03 ENCOUNTER — Encounter: Payer: Self-pay | Admitting: Radiology

## 2017-11-03 LAB — SMN1 COPY NUMBER ANALYSIS (SMA CARRIER SCREENING)

## 2017-11-03 LAB — CYSTIC FIBROSIS MUTATION 97: GENE DIS ANAL CARRIER INTERP BLD/T-IMP: NOT DETECTED

## 2017-11-09 ENCOUNTER — Encounter (HOSPITAL_COMMUNITY): Payer: Self-pay

## 2017-11-09 ENCOUNTER — Ambulatory Visit (HOSPITAL_COMMUNITY)
Admission: RE | Admit: 2017-11-09 | Discharge: 2017-11-09 | Disposition: A | Discharge status: Home / Self Care | Payer: Medicaid Other | Source: Ambulatory Visit | Attending: Family Medicine | Admitting: Family Medicine

## 2017-11-09 ENCOUNTER — Other Ambulatory Visit: Payer: Self-pay | Admitting: Family Medicine

## 2017-11-09 ENCOUNTER — Other Ambulatory Visit (HOSPITAL_COMMUNITY): Payer: Self-pay | Admitting: *Deleted

## 2017-11-09 DIAGNOSIS — Z363 Encounter for antenatal screening for malformations: Secondary | ICD-10-CM

## 2017-11-09 DIAGNOSIS — O0932 Supervision of pregnancy with insufficient antenatal care, second trimester: Secondary | ICD-10-CM | POA: Insufficient documentation

## 2017-11-09 DIAGNOSIS — Z862 Personal history of diseases of the blood and blood-forming organs and certain disorders involving the immune mechanism: Secondary | ICD-10-CM | POA: Diagnosis not present

## 2017-11-09 DIAGNOSIS — O0992 Supervision of high risk pregnancy, unspecified, second trimester: Secondary | ICD-10-CM | POA: Diagnosis present

## 2017-11-09 DIAGNOSIS — O09292 Supervision of pregnancy with other poor reproductive or obstetric history, second trimester: Secondary | ICD-10-CM | POA: Insufficient documentation

## 2017-11-09 DIAGNOSIS — Z3492 Encounter for supervision of normal pregnancy, unspecified, second trimester: Secondary | ICD-10-CM

## 2017-11-09 DIAGNOSIS — Z3A27 27 weeks gestation of pregnancy: Secondary | ICD-10-CM | POA: Diagnosis not present

## 2017-11-09 DIAGNOSIS — O99212 Obesity complicating pregnancy, second trimester: Secondary | ICD-10-CM

## 2017-11-09 DIAGNOSIS — O09522 Supervision of elderly multigravida, second trimester: Secondary | ICD-10-CM

## 2017-11-09 DIAGNOSIS — O0990 Supervision of high risk pregnancy, unspecified, unspecified trimester: Secondary | ICD-10-CM

## 2017-11-09 DIAGNOSIS — Z362 Encounter for other antenatal screening follow-up: Secondary | ICD-10-CM

## 2017-11-09 NOTE — Addendum Note (Signed)
Encounter addended by: Levonne Hubert, RDMS, RVT on: 11/09/2017 1:14 PM  Actions taken: Imaging Exam ended

## 2017-11-26 ENCOUNTER — Ambulatory Visit (INDEPENDENT_AMBULATORY_CARE_PROVIDER_SITE_OTHER): Payer: Medicaid Other | Admitting: Family Medicine

## 2017-11-26 ENCOUNTER — Encounter: Payer: Self-pay | Admitting: Family Medicine

## 2017-11-26 VITALS — BP 109/74 | HR 90 | Wt 223.6 lb

## 2017-11-26 DIAGNOSIS — Z23 Encounter for immunization: Secondary | ICD-10-CM

## 2017-11-26 DIAGNOSIS — B1910 Unspecified viral hepatitis B without hepatic coma: Secondary | ICD-10-CM

## 2017-11-26 DIAGNOSIS — O09523 Supervision of elderly multigravida, third trimester: Secondary | ICD-10-CM

## 2017-11-26 DIAGNOSIS — O0990 Supervision of high risk pregnancy, unspecified, unspecified trimester: Secondary | ICD-10-CM | POA: Diagnosis not present

## 2017-11-26 DIAGNOSIS — O98419 Viral hepatitis complicating pregnancy, unspecified trimester: Secondary | ICD-10-CM

## 2017-11-26 NOTE — Progress Notes (Signed)
   PRENATAL VISIT NOTE  Subjective:  Suzanne Little is a 37 y.o. [id number] at [redacted]w[redacted]d being seen today for ongoing prenatal care.  She is currently monitored for the following issues for this high-risk pregnancy and has Sickle cell trait (HCC); Supervision of high risk pregnancy, antepartum; Advanced maternal age in multigravida; Late prenatal care affecting pregnancy; History of gestational diabetes in prior pregnancy, currently pregnant; and Hepatitis B affecting pregnancy, antepartum on their problem list.  Patient reports no complaints.  Contractions: Not present.  .  Movement: Present. Denies leaking of fluid.   The following portions of the patient's history were reviewed and updated as appropriate: allergies, current medications, past family history, past medical history, past social history, past surgical history and problem list. Problem list updated.  Objective:   Vitals:   11/26/17 0901  BP: 109/74  Pulse: 90  Weight: 223 lb 9.6 oz (101.4 kg)    Fetal Status: Fetal Heart Rate (bpm): 140 Fundal Height: 30 cm Movement: Present     General:  Alert, oriented and cooperative. Patient is in no acute distress.  Skin: Skin is warm and dry. No rash noted.   Cardiovascular: Normal heart rate noted  Respiratory: Normal respiratory effort, no problems with respiration noted  Abdomen: Soft, gravid, appropriate for gestational age.  Pain/Pressure: Absent     Pelvic: Cervical exam deferred        Extremities: Normal range of motion.  Edema: None  Mental Status: Normal mood and affect. Normal behavior. Normal judgment and thought content.   Assessment and Plan:  Pregnancy: G5P4004 at [redacted]w[redacted]d  1. Supervision of high risk pregnancy, antepartum 28 wk labs today - Glucose Tolerance, 2 Hours w/1 Hour - CBC - RPR - HIV antibody  2. Hepatitis B affecting pregnancy, antepartum Needs labs then +/- referral for treatment based on these - Hepatitis B E Antibody - Hepatitis B E Antigen - Hepatitis  B DNA, ultraquantitative, PCR - INR/PT - Hepatitis C antibody - Hepatitis A Ab, Total  3. Multigravida of advanced maternal age in third trimester Low risk NIPT  Preterm labor symptoms and general obstetric precautions including but not limited to vaginal bleeding, contractions, leaking of fluid and fetal movement were reviewed in detail with the patient. Please refer to After Visit Summary for other counseling recommendations.  Return in 2 weeks (on 12/10/2017).  Future Appointments  Date Time Provider Department Center  12/07/2017 11:30 AM [facility] [place] 1 [facility]    Reva Bores, MD

## 2017-11-26 NOTE — Patient Instructions (Signed)

## 2017-11-27 ENCOUNTER — Other Ambulatory Visit: Payer: Self-pay

## 2017-11-27 DIAGNOSIS — O24410 Gestational diabetes mellitus in pregnancy, diet controlled: Secondary | ICD-10-CM

## 2017-11-27 LAB — HIV ANTIBODY (ROUTINE TESTING W REFLEX): HIV SCREEN 4TH GENERATION: NONREACTIVE

## 2017-11-27 LAB — GLUCOSE TOLERANCE, 2 HOURS W/ 1HR
Glucose, 1 hour: 184 mg/dL — ABNORMAL HIGH (ref 65–179)
Glucose, 2 hour: 173 mg/dL — ABNORMAL HIGH (ref 65–152)
Glucose, Fasting: 82 mg/dL (ref 65–91)

## 2017-11-27 LAB — CBC
HEMATOCRIT: 31.8 % — AB (ref 34.0–46.6)
Hemoglobin: 10.3 g/dL — ABNORMAL LOW (ref 11.1–15.9)
MCH: 27 pg (ref 26.6–33.0)
MCHC: 32.4 g/dL (ref 31.5–35.7)
MCV: 84 fL (ref 79–97)
PLATELETS: 322 10*3/uL (ref 150–450)
RBC: 3.81 x10E6/uL (ref 3.77–5.28)
RDW: 15 % (ref 12.3–15.4)
WBC: 6.9 10*3/uL (ref 3.4–10.8)

## 2017-11-27 LAB — RPR: RPR Ser Ql: NONREACTIVE

## 2017-11-27 MED ORDER — ACCU-CHEK FASTCLIX LANCETS MISC: 12 refills | Status: DC

## 2017-11-27 MED ORDER — ACCU-CHEK AVIVA PLUS W/DEVICE KIT: 1.0000 | PACK | Freq: Once | 0 refills | Status: AC

## 2017-11-27 MED ORDER — GLUCOSE BLOOD VI STRP: ORAL_STRIP | 12 refills | Status: DC

## 2017-11-27 NOTE — Telephone Encounter (Signed)
Patient failed 2-hour test and will need referral to Nutrition and diabetes. Testing supplies have been sent into [facility].

## 2017-12-01 LAB — HEPATITIS C ANTIBODY

## 2017-12-01 LAB — PROTIME-INR
INR: 1 (ref 0.8–1.2)
PROTHROMBIN TIME: 10.7 s (ref 9.1–12.0)

## 2017-12-01 LAB — HEPATITIS B E ANTIGEN: Hep B E Ag: NEGATIVE

## 2017-12-01 LAB — HEPATITIS A ANTIBODY, TOTAL: Hep A Total Ab: POSITIVE — AB

## 2017-12-01 LAB — HEPATITIS B E ANTIBODY: Hep B E Ab: POSITIVE — AB

## 2017-12-01 LAB — HEPATITIS B DNA, ULTRAQUANTITATIVE, PCR
HBV DNA SERPL PCR-ACNC: 160 IU/mL
HBV DNA SERPL PCR-LOG IU: 2.204 log10 IU/mL

## 2017-12-03 ENCOUNTER — Encounter: Payer: Self-pay | Admitting: Skilled Nursing Facility1

## 2017-12-03 ENCOUNTER — Encounter: Payer: Medicaid Other | Attending: Family Medicine | Admitting: Skilled Nursing Facility1

## 2017-12-03 DIAGNOSIS — O99810 Abnormal glucose complicating pregnancy: Secondary | ICD-10-CM

## 2017-12-03 DIAGNOSIS — O24410 Gestational diabetes mellitus in pregnancy, diet controlled: Secondary | ICD-10-CM | POA: Insufficient documentation

## 2017-12-03 DIAGNOSIS — Z6837 Body mass index (BMI) 37.0-37.9, adult: Secondary | ICD-10-CM | POA: Diagnosis not present

## 2017-12-03 DIAGNOSIS — Z713 Dietary counseling and surveillance: Secondary | ICD-10-CM | POA: Insufficient documentation

## 2017-12-03 NOTE — Progress Notes (Signed)
Diabetes Self-Management Education  Visit Type: First/Initial   12/03/2017  [name], identified by name and date of birth, is a 37 y.o. female with a diagnosis of Diabetes: Gestational Diabetes.   ASSESSMENT  Height 5\' 5"  (1.651 m), weight 226 lb 9.6 oz (102.8 kg), last menstrual period 05/29/2017, currently breastfeeding. Body mass index is 37.71 kg/m.   Pt arrived with her daughter.  Pt state she is 7 months along.   [facility] interpretor: Mo Sow [name]   Goals: 1 cup of cornmeal - eat fruit with protein -use splenda in your tea -4-5 times a week dancing or walking  Pt tested in appt 2 hours after eating: 202  Meter given: accu check guide lot [id number]  Exp: 11/07/2018 Diabetes Self-Management Education - 12/03/17 1611      Visit Information   Visit Type  First/Initial      Initial Visit   Diabetes Type  Gestational Diabetes    Are you currently following a meal plan?  No    Are you taking your medications as prescribed?  Not on Medications      Health Coping   How would you rate your overall health?  Fair      Psychosocial Assessment   Patient Belief/Attitude about Diabetes  Motivated to manage diabetes      Pre-Education Assessment   Patient understands the diabetes disease and treatment process.  Needs Instruction    Patient understands incorporating nutritional management into lifestyle.  Needs Instruction    Patient undertands incorporating physical activity into lifestyle.  Needs Instruction    Patient understands using medications safely.  Needs Instruction    Patient understands monitoring blood glucose, interpreting and using results  Needs Instruction    Patient understands prevention, detection, and treatment of acute complications.  Needs Instruction    Patient understands prevention, detection, and treatment of chronic complications.  Needs Instruction    Patient understands how to develop strategies to address psychosocial issues.  Needs  Instruction    Patient understands how to develop strategies to promote health/change behavior.  Needs Instruction      Dietary Intake   Breakfast  cereal  9am    Snack (morning)  fruit    Lunch  cornmeal and spagetti  3pm    Snack (afternoon)  fruit    Dinner  conrmeal with stew(okra adn meat)    Beverage(s)  water, tea with sugar      Exercise   Exercise Type  ADL's      Patient Education   Previous Diabetes Education  No    Disease state   Factors that contribute to the development of diabetes;Explored patient's options for treatment of their diabetes    Nutrition management   Role of diet in the treatment of diabetes and the relationship between the three main macronutrients and blood glucose level;Effects of alcohol on blood glucose and safety factors with consumption of alcohol.;Meal options for control of blood glucose level and chronic complications.;Meal timing in regards to the patients' current diabetes medication.    Physical activity and exercise   Role of exercise on diabetes management, blood pressure control and cardiac health.;Identified with patient nutritional and/or medication changes necessary with exercise.    Monitoring  Taught/evaluated SMBG meter.;Purpose and frequency of SMBG.    Acute complications  Taught treatment of hypoglycemia - the 15 rule.;Discussed and identified patients' treatment of hyperglycemia.    Chronic complications  Relationship between chronic complications and blood glucose control  Psychosocial adjustment  Role of stress on diabetes      Individualized Goals (developed by patient)   Nutrition  General guidelines for healthy choices and portions discussed;Adjust meds/carbs with exercise as discussed    Physical Activity  Exercise 5-7 days per week;30 minutes per day      Post-Education Assessment   Patient understands the diabetes disease and treatment process.  Demonstrates understanding / competency    Patient understands incorporating  nutritional management into lifestyle.  Demonstrates understanding / competency    Patient undertands incorporating physical activity into lifestyle.  Demonstrates understanding / competency    Patient understands using medications safely.  Demonstrates understanding / competency    Patient understands monitoring blood glucose, interpreting and using results  Demonstrates understanding / competency    Patient understands prevention, detection, and treatment of acute complications.  Demonstrates understanding / competency    Patient understands prevention, detection, and treatment of chronic complications.  Demonstrates understanding / competency    Patient understands how to develop strategies to address psychosocial issues.  Demonstrates understanding / competency    Patient understands how to develop strategies to promote health/change behavior.  Demonstrates understanding / competency      Outcomes   Expected Outcomes  Demonstrated interest in learning. Expect positive outcomes    Future DMSE  PRN    Program Status  Completed       Individualized Plan for Diabetes Self-Management Training:   Learning Objective:  Patient will have a greater understanding of diabetes self-management. Patient education plan is to attend individual and/or group sessions per assessed needs and concerns.   Plan:   There are no Patient Instructions on file for this visit.  Expected Outcomes:  Demonstrated interest in learning. Expect positive outcomes  Education material provided: Food label handouts, My Plate and Snack sheet  If problems or questions, patient to contact team via:  Phone  Future DSME appointment: PRN

## 2017-12-07 ENCOUNTER — Other Ambulatory Visit (HOSPITAL_COMMUNITY): Payer: Self-pay | Admitting: *Deleted

## 2017-12-07 ENCOUNTER — Ambulatory Visit (HOSPITAL_COMMUNITY)
Admission: RE | Admit: 2017-12-07 | Discharge: 2017-12-07 | Disposition: A | Discharge status: Home / Self Care | Payer: Medicaid Other | Source: Ambulatory Visit | Attending: Family Medicine | Admitting: Family Medicine

## 2017-12-07 ENCOUNTER — Encounter (HOSPITAL_COMMUNITY): Payer: Self-pay

## 2017-12-07 DIAGNOSIS — Z362 Encounter for other antenatal screening follow-up: Secondary | ICD-10-CM | POA: Insufficient documentation

## 2017-12-07 DIAGNOSIS — O09523 Supervision of elderly multigravida, third trimester: Secondary | ICD-10-CM

## 2017-12-07 DIAGNOSIS — O99213 Obesity complicating pregnancy, third trimester: Secondary | ICD-10-CM | POA: Insufficient documentation

## 2017-12-07 DIAGNOSIS — Z3A31 31 weeks gestation of pregnancy: Secondary | ICD-10-CM | POA: Insufficient documentation

## 2017-12-10 ENCOUNTER — Ambulatory Visit: Payer: Medicaid Other | Admitting: Obstetrics and Gynecology

## 2017-12-10 ENCOUNTER — Encounter: Payer: Self-pay | Admitting: *Deleted

## 2017-12-10 ENCOUNTER — Other Ambulatory Visit: Payer: Self-pay

## 2017-12-10 VITALS — BP 109/71 | HR 90 | Wt 225.8 lb

## 2017-12-10 DIAGNOSIS — O09523 Supervision of elderly multigravida, third trimester: Secondary | ICD-10-CM

## 2017-12-10 DIAGNOSIS — O24419 Gestational diabetes mellitus in pregnancy, unspecified control: Secondary | ICD-10-CM

## 2017-12-10 DIAGNOSIS — O24410 Gestational diabetes mellitus in pregnancy, diet controlled: Secondary | ICD-10-CM

## 2017-12-10 DIAGNOSIS — B1910 Unspecified viral hepatitis B without hepatic coma: Secondary | ICD-10-CM

## 2017-12-10 DIAGNOSIS — O98419 Viral hepatitis complicating pregnancy, unspecified trimester: Principal | ICD-10-CM

## 2017-12-10 DIAGNOSIS — D573 Sickle-cell trait: Secondary | ICD-10-CM

## 2017-12-10 DIAGNOSIS — O0990 Supervision of high risk pregnancy, unspecified, unspecified trimester: Secondary | ICD-10-CM

## 2017-12-10 HISTORY — DX: Gestational diabetes mellitus in pregnancy, unspecified control: O24.419

## 2017-12-10 MED ORDER — GLUCOSE BLOOD VI STRP: ORAL_STRIP | 12 refills | Status: DC

## 2017-12-10 MED ORDER — ACCU-CHEK FASTCLIX LANCETS MISC: 12 refills | Status: DC

## 2017-12-10 NOTE — Progress Notes (Signed)
Prenatal Visit Note Date: 12/10/2017 Clinic: [facility]  Subjective:  Suzanne Little is a 37 y.o. [id number] at 2276w6d being seen today for ongoing prenatal care.  She is currently monitored for the following issues for this high-risk pregnancy and has Sickle cell trait (HCC); Supervision of high risk pregnancy, antepartum; Advanced maternal age in multigravida; Late prenatal care affecting pregnancy; History of gestational diabetes in prior pregnancy, currently pregnant; Hepatitis B affecting pregnancy, antepartum; Obesity affecting pregnancy in third trimester; and GDM (gestational diabetes mellitus) on their problem list.  Patient reports pt ran out of strips.   Contractions: Not present. Vag. Bleeding: None.  Movement: Present. Denies leaking of fluid.   The following portions of the patient's history were reviewed and updated as appropriate: allergies, current medications, past family history, past medical history, past social history, past surgical history and problem list. Problem list updated.  Objective:   Vitals:   12/10/17 0930  BP: 109/71  Pulse: 90  Weight: 225 lb 12.8 oz (102.4 kg)    Fetal Status: Fetal Heart Rate (bpm): 150   Movement: Present     General:  Alert, oriented and cooperative. Patient is in no acute distress.  Skin: Skin is warm and dry. No rash noted.   Cardiovascular: Normal heart rate noted  Respiratory: Normal respiratory effort, no problems with respiration noted  Abdomen: Soft, gravid, appropriate for gestational age. Pain/Pressure: Absent     Pelvic:  Cervical exam deferred        Extremities: Normal range of motion.  Edema: None  Mental Status: Normal mood and affect. Normal behavior. Normal judgment and thought content.   Urinalysis:      Assessment and Plan:  Pregnancy: G5P4004 at 3276w6d  1. Hepatitis B affecting pregnancy, antepartum No issues. Acceptable labs at her last visit.   2. Diet controlled gestational diabetes  mellitus (GDM) in third trimester Pt only had strips that came in package and didn't know about Rx. D/w her that they are waiting at the pharmacy. She has two days worth from 6/7 and 6/8 and am fasting with one at 98 and two 2hr PP at 130. D/w her that likely will have to start medications at nv but will need more #s.   3. Supervision of high risk pregnancy, antepartum BTL papers signed today  4. Multigravida of advanced maternal age in third trimester No issues  5. Sickle cell trait (HCC) Screening [facility]  Preterm labor symptoms and general obstetric precautions including but not limited to vaginal bleeding, contractions, leaking of fluid and fetal movement were reviewed in detail with the patient. Please refer to After Visit Summary for other counseling recommendations.  Return in about 1 week (around 12/17/2017).   [place] BingPickens, Mardy Hoppe, MD

## 2017-12-18 ENCOUNTER — Encounter: Payer: Self-pay | Admitting: Obstetrics & Gynecology

## 2017-12-23 ENCOUNTER — Encounter: Payer: Self-pay | Admitting: Family Medicine

## 2017-12-23 ENCOUNTER — Ambulatory Visit (INDEPENDENT_AMBULATORY_CARE_PROVIDER_SITE_OTHER): Payer: Medicaid Other | Admitting: Family Medicine

## 2017-12-23 VITALS — BP 106/71 | HR 78 | Wt 228.2 lb

## 2017-12-23 DIAGNOSIS — O0932 Supervision of pregnancy with insufficient antenatal care, second trimester: Secondary | ICD-10-CM

## 2017-12-23 DIAGNOSIS — O98419 Viral hepatitis complicating pregnancy, unspecified trimester: Secondary | ICD-10-CM

## 2017-12-23 DIAGNOSIS — O99213 Obesity complicating pregnancy, third trimester: Secondary | ICD-10-CM

## 2017-12-23 DIAGNOSIS — O09523 Supervision of elderly multigravida, third trimester: Secondary | ICD-10-CM

## 2017-12-23 DIAGNOSIS — O24410 Gestational diabetes mellitus in pregnancy, diet controlled: Secondary | ICD-10-CM

## 2017-12-23 DIAGNOSIS — B1910 Unspecified viral hepatitis B without hepatic coma: Secondary | ICD-10-CM

## 2017-12-23 MED ORDER — PRENATAL VITAMIN 27-0.8 MG PO TABS
1.0000 | ORAL_TABLET | Freq: Every day | ORAL | 11 refills | Status: DC
Start: 2017-12-23 — End: 2018-01-11

## 2017-12-23 NOTE — Progress Notes (Signed)
   PRENATAL VISIT NOTE  Subjective:  Suzanne Little is a 37 y.o. [id number] at 8438w5d being seen today for ongoing prenatal care.  She is currently monitored for the following issues for this high-risk pregnancy and has Sickle cell trait (HCC); Supervision of high risk pregnancy, antepartum; Advanced maternal age in multigravida; Late prenatal care affecting pregnancy; History of gestational diabetes in prior pregnancy, currently pregnant; Hepatitis B affecting pregnancy, antepartum; Obesity affecting pregnancy in third trimester; and GDM (gestational diabetes mellitus) on their problem list.  Patient reports no complaints.  Contractions: Not present.  .  Movement: Present. Denies leaking of fluid.   Fasting-- 79-98 (1/5 high) Bfast-- 100-173 (3/4 high) Lunch-- 113-148 (1/2 high) Dinner (pre dinner) -108-151 (2/4 high)   The following portions of the patient's history were reviewed and updated as appropriate: allergies, current medications, past family history, past medical history, past social history, past surgical history and problem list. Problem list updated.  Objective:   Vitals:   12/23/17 1509  BP: 106/71  Pulse: 78  Weight: 228 lb 3.2 oz (103.5 kg)    Fetal Status: Fetal Heart Rate (bpm): 148   Movement: Present     General:  Alert, oriented and cooperative. Patient is in no acute distress.  Skin: Skin is warm and dry. No rash noted.   Cardiovascular: Normal heart rate noted  Respiratory: Normal respiratory effort, no problems with respiration noted  Abdomen: Soft, gravid, appropriate for gestational age.  Pain/Pressure: Absent     Pelvic: Cervical exam deferred        Extremities: Normal range of motion.  Edema: None  Mental Status: Normal mood and affect. Normal behavior. Normal judgment and thought content.   Assessment and Plan:  Pregnancy: G5P4004 at 1838w5d  1. Hepatitis B affecting pregnancy, antepartum IVIG for infant after delivery   2. Diet controlled gestational  diabetes mellitus (GDM) in third trimester Poorly controlled Has not attended Lifestyles-- will send referral for nutritional counseling If continues to have > 50% elevated after diet changes, will need to start medication next visit and was counseled regarding possible starting of medication. If meds started will need to start NSTs Needs growth US at 35-36 weeks  3. Multigravida of advanced maternal age in third trimester  4. Late prenatal care affecting pregnancy in second trimester  5. Obesity affecting pregnancy in third trimester Total weight gain = 2 lb 3.2 oz (0.998 kg) meeting goal of 11-15lb total. Encouraged healthy diet  Preterm labor symptoms and general obstetric precautions including but not limited to vaginal bleeding, contractions, leaking of fluid and fetal movement were reviewed in detail with the patient. Please refer to After Visit Summary for other counseling recommendations.  Return in about 1 week (around 12/30/2017) for Routine prenatal care.  Future Appointments  Date Time Provider Department Center  12/30/2017 11:15 AM Allie Bossierove, Myra C, MD [facility]  01/04/2018 11:00 AM [facility] US 3 [facility]    Suzanne FlakeKimberly Niles Devaunte Gasparini, MD

## 2017-12-24 NOTE — Addendum Note (Signed)
Addended by: Cheree DittoGRAHAM, DEMETRICE A on: 12/24/2017 02:28 PM   Modules accepted: Orders

## 2017-12-30 ENCOUNTER — Encounter: Payer: Self-pay | Admitting: Obstetrics & Gynecology

## 2018-01-04 ENCOUNTER — Other Ambulatory Visit (HOSPITAL_COMMUNITY): Payer: Self-pay | Admitting: Obstetrics and Gynecology

## 2018-01-04 ENCOUNTER — Ambulatory Visit (HOSPITAL_COMMUNITY)
Admission: RE | Admit: 2018-01-04 | Discharge: 2018-01-04 | Disposition: A | Discharge status: Home / Self Care | Payer: Medicaid Other | Source: Ambulatory Visit | Attending: Family Medicine | Admitting: Family Medicine

## 2018-01-04 ENCOUNTER — Ambulatory Visit (INDEPENDENT_AMBULATORY_CARE_PROVIDER_SITE_OTHER): Payer: Medicaid Other | Admitting: Obstetrics & Gynecology

## 2018-01-04 ENCOUNTER — Encounter (HOSPITAL_COMMUNITY): Payer: Self-pay

## 2018-01-04 VITALS — BP 99/66 | HR 92 | Wt 230.6 lb

## 2018-01-04 DIAGNOSIS — B1910 Unspecified viral hepatitis B without hepatic coma: Secondary | ICD-10-CM

## 2018-01-04 DIAGNOSIS — O09523 Supervision of elderly multigravida, third trimester: Secondary | ICD-10-CM

## 2018-01-04 DIAGNOSIS — Z362 Encounter for other antenatal screening follow-up: Secondary | ICD-10-CM

## 2018-01-04 DIAGNOSIS — O24410 Gestational diabetes mellitus in pregnancy, diet controlled: Secondary | ICD-10-CM | POA: Insufficient documentation

## 2018-01-04 DIAGNOSIS — O98419 Viral hepatitis complicating pregnancy, unspecified trimester: Secondary | ICD-10-CM

## 2018-01-04 DIAGNOSIS — Z3A35 35 weeks gestation of pregnancy: Secondary | ICD-10-CM

## 2018-01-04 DIAGNOSIS — O99213 Obesity complicating pregnancy, third trimester: Secondary | ICD-10-CM

## 2018-01-04 DIAGNOSIS — O0990 Supervision of high risk pregnancy, unspecified, unspecified trimester: Secondary | ICD-10-CM

## 2018-01-04 DIAGNOSIS — O0993 Supervision of high risk pregnancy, unspecified, third trimester: Secondary | ICD-10-CM

## 2018-01-04 NOTE — Patient Instructions (Signed)
Return to clinic for any scheduled appointments or obstetric concerns, or go to [facility] for evaluation  

## 2018-01-04 NOTE — Progress Notes (Signed)
PRENATAL VISIT NOTE  Subjective:  Suzanne Little is a 37 y.o. [id number] at 1072w3d being seen today for ongoing prenatal care.  She is currently monitored for the following issues for this high-risk pregnancy and has Sickle cell trait (HCC); Supervision of high risk pregnancy, antepartum; Advanced maternal age in multigravida; Late prenatal care affecting pregnancy; History of gestational diabetes in prior pregnancy, currently pregnant; Hepatitis B affecting pregnancy, antepartum; Obesity affecting pregnancy in third trimester; and GDM (gestational diabetes mellitus) on their problem list.  Patient reports no complaints.  Contractions: Not present. Vag. Bleeding: None.  Movement: Present. Denies leaking of fluid.   The following portions of the patient's history were reviewed and updated as appropriate: allergies, current medications, past family history, past medical history, past social history, past surgical history and problem list. Problem list updated.  Objective:   Vitals:   01/04/18 1458  BP: 99/66  Pulse: 92  Weight: 230 lb 9.6 oz (104.6 kg)    Fetal Status: Fetal Heart Rate (bpm): 158   Movement: Present     General:  Alert, oriented and cooperative. Patient is in no acute distress.  Skin: Skin is warm and dry. No rash noted.   Cardiovascular: Normal heart rate noted  Respiratory: Normal respiratory effort, no problems with respiration noted  Abdomen: Soft, gravid, appropriate for gestational age.  Pain/Pressure: Absent     Pelvic: Cervical exam deferred        Extremities: Normal range of motion.  Edema: Moderate pitting, indentation subsides rapidly  Mental Status: Normal mood and affect. Normal behavior. Normal judgment and thought content.   [place] Mfm Ob Follow Up  Result Date: 01/04/2018 ----------------------------------------------------------------------  OBSTETRICS REPORT                      (Signed Final 01/04/2018 12:13 pm)  ---------------------------------------------------------------------- Patient Info  ID #:       [id number]                          D.O.B.:  1980-12-21 (37 yrs)  Name:       Suzanne BlueKANLE Weill                    Visit Date: 01/04/2018 12:00 pm ---------------------------------------------------------------------- Performed By  Performed By:     Vivien Rotaachael H Small        Ref. Address[address]     [address]                    [address]                                                             [address]  Attending:        Noralee Spaceavi Shankar MD        [facility]  [facility] ---------------------------------------------------------------------- Orders   #  Description                                 Code   1  [place] MFM OB FOLLOW UP                         [id number]  ----------------------------------------------------------------------   #  Ordered By               Order #        Accession #    Episode #   1  JEFFREY Marjo Bicker           [id number]      [id number]     [id number]  ---------------------------------------------------------------------- Indications   [redacted] weeks gestation of pregnancy                Z3A.28   Advanced maternal age multigravida 62+,        O62.522   second trimester (declined GC)   Obesity complicating pregnancy, second         O99.212   trimester   Late prenatal care, second trimester           O09.32   History of sickle cell trait                   Z86.2   Poor obstetric history: Previous gestational   O09.299   diabetes   Encounter for other antenatal screening        Z36.2   follow-up  ---------------------------------------------------------------------- OB History  Blood Type:            Height:  5'5"   Weight (lb):  225       BMI:  37.44  Gravidity:    5         Term:   4        Prem:   0        SAB:   0  TOP:          0       Ectopic:  0        Living: 4  ---------------------------------------------------------------------- Fetal Evaluation  Num Of Fetuses:     1  Fetal Heart         149  Rate(bpm):  Cardiac Activity:   Observed  Presentation:       Cephalic  Placenta:           Posterior, above cervical os  P. Cord Insertion:  Previously Visualized  Amniotic Fluid  AFI FV:      Subjectively within normal limits  AFI Sum(cm)     %Tile       Largest Pocket(cm)  11.08           29          3.14  RUQ(cm)       RLQ(cm)       LUQ(cm)        LLQ(cm)  1.92          2.91          3.11           3.14 ---------------------------------------------------------------------- Biometry  BPD:      83.4  mm     G. Age:  33w 4d         10  %    CI:  70.68   %    70 - 86                                                          FL/HC:      21.8   %    20.1 - 22.3  HC:      316.2  mm     G. Age:  35w 4d         21  %    HC/AC:      1.01        0.93 - 1.11  AC:      313.1  mm     G. Age:  35w 1d         53  %    FL/BPD:     82.5   %    71 - 87  FL:       68.8  mm     G. Age:  35w 2d         41  %    FL/AC:      22.0   %    20 - 24  HUM:      62.7  mm     G. Age:  36w 3d         82  %  Est. FW:    2597  gm    5 lb 12 oz      56  % ---------------------------------------------------------------------- Gestational Age  LMP:           31w 3d        Date:  05/29/17                 EDD:   03/05/18  U/S Today:     34w 6d                                        EDD:   02/09/18  Best:          35w 3d     Det. By:  U/S  (11/09/17)          EDD:   02/05/18 ---------------------------------------------------------------------- Anatomy  Cranium:               Appears normal         Aortic Arch:            Previously seen  Cavum:                 Previously seen        Ductal Arch:            Previously seen  Ventricles:            Appears normal         Diaphragm:              Previously seen  Choroid Plexus:        Previously seen        Stomach:                Appears normal, left  sided  Cerebellum:            Previously seen        Abdomen:                Appears normal  Posterior Fossa:       Previously seen        Abdominal Wall:         Previously seen  Nuchal Fold:           Not applicable (>20    Cord Vessels:           Previously seen                         wks GA)  Face:                  Profile not visualized Kidneys:                Appear normal  Lips:                  Previously seen        Bladder:                Appears normal  Thoracic:              Appears normal         Spine:                  Previously seen  Heart:                 Not well visualized    Upper Extremities:      Previously seen  RVOT:                  Previously seen        Lower Extremities:      Previously seen  LVOT:                  Appears normal  Other:  Fetus appears to be a female. Technically difficult due to fetal position. ---------------------------------------------------------------------- Cervix Uterus Adnexa  Cervix  Normal appearance by transabdominal scan.  Uterus  No abnormality visualized.  Left Ovary  Not visualized.  Right Ovary  Not visualized.  Adnexa:       No abnormality visualized. No adnexal mass                visualized. ---------------------------------------------------------------------- Impression  Low-risk for Down syndrome on cell-free fetal DNA screening.  Gestational diabetes. Well-controlled on diet.  Fetal growth is appropriate for gestational age. Amniotic fluid  is normal and good fetal activity is seen. ---------------------------------------------------------------------- Recommendations  Recommend weekly antenatal testing from 36 weeks till  delivery. ----------------------------------------------------------------------                  Noralee Space, MD Electronically Signed Final Report   01/04/2018 12:13 pm ----------------------------------------------------------------------  [place] Mfm Ob Follow Up  Result  Date: 12/07/2017 ----------------------------------------------------------------------  OBSTETRICS REPORT                      (Signed Final 12/07/2017 01:06 pm) ---------------------------------------------------------------------- Patient Info  ID #:       [id number]                          D.O.B.:  11/12/1980 (37 yrs)  Name:  Suzanne Little                    Visit Date: 12/07/2017 12:29 pm ---------------------------------------------------------------------- Performed By  Performed By:     Emeline Darling BS,      Ref. Address:     [address]                    [address]                                                             [address]  Attending:        Durwin Nora       Location:         [facility]                    MD  Referred By:      [facility] for                    [facility]                    Healthcare ---------------------------------------------------------------------- Orders   #  Description                                 Code   1  [place] MFM OB FOLLOW UP                         40981.19  ----------------------------------------------------------------------   #  Ordered By               Order #        Accession #    Episode #   1  Charlsie Merles              [id number]      [id number]     [id number]  ---------------------------------------------------------------------- Indications   [redacted] weeks gestation of pregnancy                Z3A.5   Advanced maternal age multigravida 62+,        O54.522   second trimester (declined GC)   Obesity complicating pregnancy, second         O99.212   trimester   Late prenatal care, second trimester           O09.32   History of sickle cell trait                   Z86.2   Poor obstetric history: Previous gestational   O09.299   diabetes   Encounter for other antenatal screening        Z36.2   follow-up  ---------------------------------------------------------------------- OB History  Blood Type:            Height:  5'5"   Weight (lb):   225       BMI:  37.44  Gravidity:    5         Term:   4  Prem:   0        SAB:   0  TOP:          0       Ectopic:  0        Living: 4 ---------------------------------------------------------------------- Fetal Evaluation  Num Of Fetuses:     1  Fetal Heart         151  Rate(bpm):  Cardiac Activity:   Observed  Presentation:       Cephalic  Placenta:           Posterior, above cervical os  P. Cord Insertion:  Previously Visualized  Amniotic Fluid  AFI FV:      Subjectively within normal limits  AFI Sum(cm)     %Tile       Largest Pocket(cm)  14.49           50          4.25  RUQ(cm)       RLQ(cm)       LUQ(cm)        LLQ(cm)  4.25          3.07          4.15           3.02 ---------------------------------------------------------------------- Biometry  BPD:        77  mm     G. Age:  30w 6d         24  %    CI:        76.22   %    70 - 86                                                          FL/HC:      22.6   %    19.3 - 21.3  HC:      279.5  mm     G. Age:  30w 4d          5  %    HC/AC:      1.07        0.96 - 1.17  AC:      260.4  mm     G. Age:  30w 1d         16  %    FL/BPD:     81.9   %    71 - 87  FL:       63.1  mm     G. Age:  32w 5d         70  %    FL/AC:      24.2   %    20 - 24  Est. FW:    1693  gm    3 lb 12 oz      48  % ---------------------------------------------------------------------- Gestational Age  LMP:           27w 3d        Date:  05/29/17                 EDD:   03/05/18  U/S Today:     31w 1d  EDD:   02/07/18  Best:          31w 3d     Det. By:  U/S  (11/09/17)          EDD:   02/05/18 ---------------------------------------------------------------------- Anatomy  Cranium:               Appears normal         Aortic Arch:            Appears normal  Cavum:                 Previously seen        Ductal Arch:            Previously seen  Ventricles:            Previously seen        Diaphragm:              Previously seen  Choroid Plexus:         Previously seen        Stomach:                Appears normal, left                                                                        sided  Cerebellum:            Previously seen        Abdomen:                Appears normal  Posterior Fossa:       Previously seen        Abdominal Wall:         Previously seen  Nuchal Fold:           Not applicable (>20    Cord Vessels:           Previously seen                         wks GA)  Face:                  Absent nasal bone      Kidneys:                Appear normal                         prev seen  Lips:                  Previously seen        Bladder:                Appears normal  Thoracic:              Appears normal         Spine:                  Previously seen  Heart:                 Not well visualized    Upper Extremities:      Previously seen  RVOT:  Previously seen        Lower Extremities:      Previously seen  LVOT:                  Previously seen  Other:  Fetus appears to be a female. Technically difficult due to fetal position. ---------------------------------------------------------------------- Cervix Uterus Adnexa  Cervix  Not visualized (advanced GA >29wks) ---------------------------------------------------------------------- Impression  Single living intrauterine pregnancy at 31w 3d, obesity, AMA  Cephalic presentation.  active fetus  Placenta Posterior, above cervical os.  Normal amniotic fluid volume.  Appropriate interval fetal growth.  Absent nasal bone is again seen (previously counseled)  low risk NIPS ---------------------------------------------------------------------- Recommendations  Interval growth in 4 weeks. ----------------------------------------------------------------------               Durwin Nora, MD Electronically Signed Final Report   12/07/2017 01:06 pm ----------------------------------------------------------------------   Assessment and Plan:  Pregnancy: O9G2952 at [redacted]w[redacted]d  1. Diet  controlled gestational diabetes mellitus (GDM) in third trimester Reviewed CBGs, a couple of elevated fasting and PP, otherwise normal.  Continue diet and exercise. Normal growth scan today. No current need for antenatal testing.  2. Supervision of high risk pregnancy, antepartum Preterm labor symptoms and general obstetric precautions including but not limited to vaginal bleeding, contractions, leaking of fluid and fetal movement were reviewed in detail with the patient. Please refer to After Visit Summary for other counseling recommendations.  Return in about 1 week (around 01/11/2018) for Pelvic cultures, OB Visit.    Jaynie Collins, MD

## 2018-01-11 ENCOUNTER — Other Ambulatory Visit (HOSPITAL_COMMUNITY)
Admission: RE | Admit: 2018-01-11 | Discharge: 2018-01-11 | Disposition: A | Discharge status: Home / Self Care | Payer: Medicaid Other | Source: Ambulatory Visit | Attending: Obstetrics & Gynecology | Admitting: Obstetrics & Gynecology

## 2018-01-11 ENCOUNTER — Ambulatory Visit (INDEPENDENT_AMBULATORY_CARE_PROVIDER_SITE_OTHER): Payer: Medicaid Other | Admitting: Obstetrics & Gynecology

## 2018-01-11 VITALS — BP 112/76 | HR 91 | Wt 232.2 lb

## 2018-01-11 DIAGNOSIS — O24410 Gestational diabetes mellitus in pregnancy, diet controlled: Secondary | ICD-10-CM

## 2018-01-11 DIAGNOSIS — O0990 Supervision of high risk pregnancy, unspecified, unspecified trimester: Secondary | ICD-10-CM | POA: Diagnosis present

## 2018-01-11 DIAGNOSIS — O0993 Supervision of high risk pregnancy, unspecified, third trimester: Secondary | ICD-10-CM

## 2018-01-11 DIAGNOSIS — Z3A36 36 weeks gestation of pregnancy: Secondary | ICD-10-CM | POA: Insufficient documentation

## 2018-01-11 NOTE — Addendum Note (Signed)
Addended by: Jaynie CollinsANYANWU, Ivonna Kinnick A on: 01/11/2018 04:47 PM   Modules accepted: Orders

## 2018-01-11 NOTE — Patient Instructions (Signed)
Return to clinic for any scheduled appointments or obstetric concerns, or go to [facility] for evaluation  

## 2018-01-11 NOTE — Progress Notes (Addendum)
PRENATAL VISIT NOTE  Subjective:  Suzanne Little is a 37 y.o. Z6X0960 at [redacted]w[redacted]d being seen today for ongoing prenatal care.  She is currently monitored for the following issues for this high-risk pregnancy and has Sickle cell trait (HCC); Supervision of high risk pregnancy, antepartum; Advanced maternal age in multigravida; Late prenatal care affecting pregnancy; History of gestational diabetes in prior pregnancy, currently pregnant; Hepatitis B affecting pregnancy, antepartum; Obesity affecting pregnancy in third trimester; and GDM (gestational diabetes mellitus) on their problem list.  Patient reports no complaints.  Contractions: Not present. Vag. Bleeding: None.  Movement: Present. Denies leaking of fluid.   The following portions of the patient's history were reviewed and updated as appropriate: allergies, current medications, past family history, past medical history, past social history, past surgical history and problem list. Problem list updated.  Objective:   Vitals:   01/11/18 1540  BP: 112/76  Pulse: 91  Weight: 232 lb 3.2 oz (105.3 kg)    Fetal Status: Fetal Heart Rate (bpm): 164 Fundal Height: 37 cm Movement: Present  Presentation: Vertex  General:  Alert, oriented and cooperative. Patient is in no acute distress.  Skin: Skin is warm and dry. No rash noted.   Cardiovascular: Normal heart rate noted  Respiratory: Normal respiratory effort, no problems with respiration noted  Abdomen: Soft, gravid, appropriate for gestational age.  Pain/Pressure: Present     Pelvic: Cervical exam performed Dilation: 1 Effacement (%): Thick Station: Ballotable  Extremities: Normal range of motion.  Edema: Moderate pitting, indentation subsides rapidly  Mental Status: Normal mood and affect. Normal behavior. Normal judgment and thought content.   [place] Mfm Ob Follow Up  Result Date: 01/04/2018 ----------------------------------------------------------------------  OBSTETRICS REPORT                       (Signed Final 01/04/2018 12:13 pm) ---------------------------------------------------------------------- Patient Info  ID #:       [id number]                          D.O.B.:  05-Oct-1980 (37 yrs)  Name:       Suzanne Little                    Visit Date: 01/04/2018 12:00 pm ---------------------------------------------------------------------- Performed By  Performed By:     Vivien Rota        Ref. Address:     [address]                    [address]                                                             [address]  Attending:        Noralee Space MD        Location:         [facility]  Referred By:      [facility] for                    [facility]  Healthcare ---------------------------------------------------------------------- Orders   #  Description                                 Code   1  [place] MFM OB FOLLOW UP                         [id number]  ----------------------------------------------------------------------   #  Ordered By               Order #        Accession #    Episode #   1  JEFFREY Marjo Bicker           [id number]      [id number]     [id number]  ---------------------------------------------------------------------- Indications   [redacted] weeks gestation of pregnancy                Z3A.4   Advanced maternal age multigravida 31+,        O71.522   second trimester (declined GC)   Obesity complicating pregnancy, second         O99.212   trimester   Late prenatal care, second trimester           O09.32   History of sickle cell trait                   Z86.2   Poor obstetric history: Previous gestational   O09.299   diabetes   Encounter for other antenatal screening        Z36.2   follow-up  ---------------------------------------------------------------------- OB History  Blood Type:            Height:  5'5"   Weight (lb):  225       BMI:  37.44  Gravidity:    5         Term:   4        Prem:   0        SAB:   0  TOP:          0       Ectopic:  0        Living: 4  ---------------------------------------------------------------------- Fetal Evaluation  Num Of Fetuses:     1  Fetal Heart         149  Rate(bpm):  Cardiac Activity:   Observed  Presentation:       Cephalic  Placenta:           Posterior, above cervical os  P. Cord Insertion:  Previously Visualized  Amniotic Fluid  AFI FV:      Subjectively within normal limits  AFI Sum(cm)     %Tile       Largest Pocket(cm)  11.08           29          3.14  RUQ(cm)       RLQ(cm)       LUQ(cm)        LLQ(cm)  1.92          2.91          3.11           3.14 ---------------------------------------------------------------------- Biometry  BPD:      83.4  mm     G. Age:  33w 4d         10  %    CI:  70.68   %    70 - 86                                                          FL/HC:      21.8   %    20.1 - 22.3  HC:      316.2  mm     G. Age:  35w 4d         21  %    HC/AC:      1.01        0.93 - 1.11  AC:      313.1  mm     G. Age:  35w 1d         53  %    FL/BPD:     82.5   %    71 - 87  FL:       68.8  mm     G. Age:  35w 2d         41  %    FL/AC:      22.0   %    20 - 24  HUM:      62.7  mm     G. Age:  36w 3d         82  %  Est. FW:    2597  gm    5 lb 12 oz      56  % ---------------------------------------------------------------------- Gestational Age  LMP:           31w 3d        Date:  05/29/17                 EDD:   03/05/18  U/S Today:     34w 6d                                        EDD:   02/09/18  Best:          35w 3d     Det. By:  U/S  (11/09/17)          EDD:   02/05/18 ---------------------------------------------------------------------- Anatomy  Cranium:               Appears normal         Aortic Arch:            Previously seen  Cavum:                 Previously seen        Ductal Arch:            Previously seen  Ventricles:            Appears normal         Diaphragm:              Previously seen  Choroid Plexus:        Previously seen        Stomach:                Appears normal, left  sided  Cerebellum:            Previously seen        Abdomen:                Appears normal  Posterior Fossa:       Previously seen        Abdominal Wall:         Previously seen  Nuchal Fold:           Not applicable (>20    Cord Vessels:           Previously seen                         wks GA)  Face:                  Profile not visualized Kidneys:                Appear normal  Lips:                  Previously seen        Bladder:                Appears normal  Thoracic:              Appears normal         Spine:                  Previously seen  Heart:                 Not well visualized    Upper Extremities:      Previously seen  RVOT:                  Previously seen        Lower Extremities:      Previously seen  LVOT:                  Appears normal  Other:  Fetus appears to be a female. Technically difficult due to fetal position. ---------------------------------------------------------------------- Cervix Uterus Adnexa  Cervix  Normal appearance by transabdominal scan.  Uterus  No abnormality visualized.  Left Ovary  Not visualized.  Right Ovary  Not visualized.  Adnexa:       No abnormality visualized. No adnexal mass                visualized. ---------------------------------------------------------------------- Impression  Low-risk for Down syndrome on cell-free fetal DNA screening.  Gestational diabetes. Well-controlled on diet.  Fetal growth is appropriate for gestational age. Amniotic fluid  is normal and good fetal activity is seen. ---------------------------------------------------------------------- Recommendations  Recommend weekly antenatal testing from 36 weeks till  delivery. ----------------------------------------------------------------------                  Noralee Space, MD Electronically Signed Final Report   01/04/2018 12:13 pm ----------------------------------------------------------------------   Assessment and Plan:    Pregnancy: [id number] at [redacted]w[redacted]d  1. Diet controlled gestational diabetes mellitus (GDM) in third trimester CBGs within range  2. Supervision of high risk pregnancy, antepartum Pelvic cultures done today - Strep Gp B NAA - Cervicovaginal ancillary only Preterm labor symptoms and general obstetric precautions including but not limited to vaginal bleeding, contractions, leaking of fluid and fetal movement were reviewed in detail with the patient. BTL papers signed 6/13 - wants immediate PP BTL, missed interval one last time and she got  pregnant. Please refer to After Visit Summary for other counseling recommendations.  Return in about 1 week (around 01/18/2018) for OB Visit.  Jaynie CollinsUgonna Izell Labat, MD

## 2018-01-13 LAB — CERVICOVAGINAL ANCILLARY ONLY
CHLAMYDIA, DNA PROBE: NEGATIVE
Neisseria Gonorrhea: NEGATIVE

## 2018-01-13 LAB — STREP GP B NAA: Strep Gp B NAA: NEGATIVE

## 2018-01-18 ENCOUNTER — Ambulatory Visit (INDEPENDENT_AMBULATORY_CARE_PROVIDER_SITE_OTHER): Payer: Medicaid Other | Admitting: Family Medicine

## 2018-01-18 VITALS — BP 110/76 | HR 72 | Wt 231.6 lb

## 2018-01-18 DIAGNOSIS — O09523 Supervision of elderly multigravida, third trimester: Secondary | ICD-10-CM

## 2018-01-18 DIAGNOSIS — O0990 Supervision of high risk pregnancy, unspecified, unspecified trimester: Secondary | ICD-10-CM

## 2018-01-18 DIAGNOSIS — B1910 Unspecified viral hepatitis B without hepatic coma: Secondary | ICD-10-CM

## 2018-01-18 DIAGNOSIS — O24410 Gestational diabetes mellitus in pregnancy, diet controlled: Secondary | ICD-10-CM

## 2018-01-18 DIAGNOSIS — O98419 Viral hepatitis complicating pregnancy, unspecified trimester: Secondary | ICD-10-CM

## 2018-01-18 DIAGNOSIS — D573 Sickle-cell trait: Secondary | ICD-10-CM

## 2018-01-18 NOTE — Progress Notes (Signed)
Did not bring blood sugar log.

## 2018-01-18 NOTE — Progress Notes (Signed)
   PRENATAL VISIT NOTE  Subjective:  Suzanne Little is a 37 y.o. [id number] at 6516w3d being seen today for ongoing prenatal care.  She is currently monitored for the following issues for this high-risk pregnancy and has Sickle cell trait (HCC); Supervision of high risk pregnancy, antepartum; Advanced maternal age in multigravida; Late prenatal care affecting pregnancy; History of gestational diabetes in prior pregnancy, currently pregnant; Hepatitis B affecting pregnancy, antepartum; Obesity affecting pregnancy in third trimester; and GDM (gestational diabetes mellitus) on their problem list.  Patient reports no complaints.  Contractions: Not present.  .  Movement: Present. Denies leaking of fluid.   The following portions of the patient's history were reviewed and updated as appropriate: allergies, current medications, past family history, past medical history, past social history, past surgical history and problem list. Problem list updated.  Objective:   Vitals:   01/18/18 1542  BP: 110/76  Pulse: 72  Weight: 231 lb 9.6 oz (105.1 kg)    Fetal Status: Fetal Heart Rate (bpm): 154 Fundal Height: 38 cm Movement: Present  Presentation: Vertex  General:  Alert, oriented and cooperative. Patient is in no acute distress.  Skin: Skin is warm and dry. No rash noted.   Cardiovascular: Normal heart rate noted  Respiratory: Normal respiratory effort, no problems with respiration noted  Abdomen: Soft, gravid, appropriate for gestational age.  Pain/Pressure: Present     Pelvic: Cervical exam deferred        Extremities: Normal range of motion.     Mental Status: Normal mood and affect. Normal behavior. Normal judgment and thought content.   Assessment and Plan:  Pregnancy: G5P4004 at 7916w3d  1. Supervision of high risk pregnancy, antepartum FHT and FH normal  2. Sickle cell trait (HCC) - Urine Culture  3. Diet controlled gestational diabetes mellitus (GDM) in third trimester Did not bring log  book. States fasting 85-87. 2hr PP 100-120. Occasionally elevated to 130s.  4. Hepatitis B affecting pregnancy, antepartum  5. Multigravida of advanced maternal age in third trimester No antenatal testing needed  Preterm labor symptoms and general obstetric precautions including but not limited to vaginal bleeding, contractions, leaking of fluid and fetal movement were reviewed in detail with the patient. Please refer to After Visit Summary for other counseling recommendations.  No follow-ups on file.  Future Appointments  Date Time Provider Department Center  01/25/2018  3:45 PM Anyanwu, Jethro BastosUgonna A, MD [facility]  02/01/2018  3:15 PM Anyanwu, Jethro BastosUgonna A, MD [facility]    Levie HeritageJacob J Chaye Misch, DO

## 2018-01-25 ENCOUNTER — Ambulatory Visit (INDEPENDENT_AMBULATORY_CARE_PROVIDER_SITE_OTHER): Payer: Medicaid Other | Admitting: Obstetrics & Gynecology

## 2018-01-25 VITALS — BP 115/79 | HR 89 | Wt 235.8 lb

## 2018-01-25 DIAGNOSIS — O0990 Supervision of high risk pregnancy, unspecified, unspecified trimester: Secondary | ICD-10-CM

## 2018-01-25 DIAGNOSIS — O24410 Gestational diabetes mellitus in pregnancy, diet controlled: Secondary | ICD-10-CM

## 2018-01-25 NOTE — Progress Notes (Signed)
PRENATAL VISIT NOTE  Subjective:  Suzanne Little is a 37 y.o. [id number] at [redacted]w[redacted]d being seen today for ongoing prenatal care.  She is currently monitored for the following issues for this high-risk pregnancy and has Sickle cell trait (HCC); Supervision of high risk pregnancy, antepartum; Advanced maternal age in multigravida; Late prenatal care affecting pregnancy; History of gestational diabetes in prior pregnancy, currently pregnant; Hepatitis B affecting pregnancy, antepartum; Obesity affecting pregnancy in third trimester; and GDM (gestational diabetes mellitus) on their problem list.  Patient reports no complaints.  Contractions: Not present. Vag. Bleeding: None.  Movement: Present. Denies leaking of fluid.   The following portions of the patient's history were reviewed and updated as appropriate: allergies, current medications, past family history, past medical history, past social history, past surgical history and problem list. Problem list updated.  Objective:   Vitals:   01/25/18 1559  BP: 115/79  Pulse: 89  Weight: 235 lb 12.8 oz (107 kg)    Fetal Status: Fetal Heart Rate (bpm): 160 Fundal Height: 40 cm Movement: Present     General:  Alert, oriented and cooperative. Patient is in no acute distress.  Skin: Skin is warm and dry. No rash noted.   Cardiovascular: Normal heart rate noted  Respiratory: Normal respiratory effort, no problems with respiration noted  Abdomen: Soft, gravid, appropriate for gestational age.  Pain/Pressure: Absent     Pelvic: Cervical exam performed        Extremities: Normal range of motion.  Edema: Mild pitting, slight indentation  Mental Status: Normal mood and affect. Normal behavior. Normal judgment and thought content.   [place] [facility] Ob Follow Up  Result Date: 01/04/2018 ----------------------------------------------------------------------  OBSTETRICS REPORT                      (Signed Final 01/04/2018 12:13 pm)  ---------------------------------------------------------------------- Patient Info  ID #:       [id number]                          D.O.B.:  Jul 10, 1980 (37 yrs)  Name:       Suzanne Little                    Visit Date: 01/04/2018 12:00 pm ---------------------------------------------------------------------- Performed By  Performed By:     Vivien Rota        Ref. Address:     [address]                    [address]                                                             [address]  Attending:        Noralee Space MD        Location:         [facility]  Referred By:      [facility] for                    [facility]  [facility] ---------------------------------------------------------------------- Orders   #  Description                                 Code   1  [place] [facility]FM OB FOLLOW UP                         [id number]  ----------------------------------------------------------------------   #  Ordered By               Order #        Accession #    Episode #   1  JEFFREY Marjo Bicker           [id number]      [id number]     [id number]  ---------------------------------------------------------------------- Indications   [redacted] weeks gestation of pregnancy                Z3A.25   Advanced maternal age multigravida 41+,        O69.522   second trimester (declined GC)   Obesity complicating pregnancy, second         O99.212   trimester   Late prenatal care, second trimester           O09.32   History of sickle cell trait                   Z86.2   Poor obstetric history: Previous gestational   O09.299   diabetes   Encounter for other antenatal screening        Z36.2   follow-up  ---------------------------------------------------------------------- OB History  Blood Type:            Height:  5'5"   Weight (lb):  225       BMI:  37.44  Gravidity:    5         Term:   4        Prem:   0        SAB:   0  TOP:          0       Ectopic:  0        Living: 4  ---------------------------------------------------------------------- Fetal Evaluation  Num Of Fetuses:     1  Fetal Heart         149  Rate(bpm):  Cardiac Activity:   Observed  Presentation:       Cephalic  Placenta:           Posterior, above cervical os  P. Cord Insertion:  Previously Visualized  Amniotic Fluid  AFI FV:      Subjectively within normal limits  AFI Sum(cm)     %Tile       Largest Pocket(cm)  11.08           29          3.14  RUQ(cm)       RLQ(cm)       LUQ(cm)        LLQ(cm)  1.92          2.91          3.11           3.14 ---------------------------------------------------------------------- Biometry  BPD:      83.4  mm     G. Age:  33w 4d         10  %    CI:  70.68   %    70 - 86                                                          FL/HC:      21.8   %    20.1 - 22.3  HC:      316.2  mm     G. Age:  35w 4d         21  %    HC/AC:      1.01        0.93 - 1.11  AC:      313.1  mm     G. Age:  35w 1d         53  %    FL/BPD:     82.5   %    71 - 87  FL:       68.8  mm     G. Age:  35w 2d         41  %    FL/AC:      22.0   %    20 - 24  HUM:      62.7  mm     G. Age:  36w 3d         82  %  Est. FW:    2597  gm    5 lb 12 oz      56  % ---------------------------------------------------------------------- Gestational Age  LMP:           31w 3d        Date:  05/29/17                 EDD:   03/05/18  U/S Today:     34w 6d                                        EDD:   02/09/18  Best:          35w 3d     Det. By:  U/S  (11/09/17)          EDD:   02/05/18 ---------------------------------------------------------------------- Anatomy  Cranium:               Appears normal         Aortic Arch:            Previously seen  Cavum:                 Previously seen        Ductal Arch:            Previously seen  Ventricles:            Appears normal         Diaphragm:              Previously seen  Choroid Plexus:        Previously seen        Stomach:                Appears normal, left  sided  Cerebellum:            Previously seen        Abdomen:                Appears normal  Posterior Fossa:       Previously seen        Abdominal Wall:         Previously seen  Nuchal Fold:           Not applicable (>20    Cord Vessels:           Previously seen                         wks GA)  Face:                  Profile not visualized Kidneys:                Appear normal  Lips:                  Previously seen        Bladder:                Appears normal  Thoracic:              Appears normal         Spine:                  Previously seen  Heart:                 Not well visualized    Upper Extremities:      Previously seen  RVOT:                  Previously seen        Lower Extremities:      Previously seen  LVOT:                  Appears normal  Other:  Fetus appears to be a female. Technically difficult due to fetal position. ---------------------------------------------------------------------- Cervix Uterus Adnexa  Cervix  Normal appearance by transabdominal scan.  Uterus  No abnormality visualized.  Left Ovary  Not visualized.  Right Ovary  Not visualized.  Adnexa:       No abnormality visualized. No adnexal mass                visualized. ---------------------------------------------------------------------- Impression  Low-risk for Down syndrome on cell-free fetal DNA screening.  Gestational diabetes. Well-controlled on diet.  Fetal growth is appropriate for gestational age. Amniotic fluid  is normal and good fetal activity is seen. ---------------------------------------------------------------------- Recommendations  Recommend weekly antenatal testing from 36 weeks till  delivery. ----------------------------------------------------------------------                  Noralee Space, MD Electronically Signed Final Report   01/04/2018 12:13 pm ----------------------------------------------------------------------   Assessment and Plan:    Pregnancy: K4M0102 at [redacted]w[redacted]d  1. Diet controlled gestational diabetes mellitus (GDM) in third trimester Normal CBGs.  Continue diet and exercise.   2. Supervision of high risk pregnancy, antepartum Term labor symptoms and general obstetric precautions including but not limited to vaginal bleeding, contractions, leaking of fluid and fetal movement were reviewed in detail with the patient. Please refer to After Visit Summary for other counseling recommendations.  Return in about 1 week (around 02/01/2018) for OB Visit.  Future Appointments  Date Time Provider Department Center  02/01/2018  3:15 PM Jamaurie Bernier, Jethro BastosUgonna A, MD [facility]    Jaynie CollinsUgonna Aerionna Moravek, MD

## 2018-01-25 NOTE — Patient Instructions (Signed)
Return to clinic for any scheduled appointments or obstetric concerns, or go to [facility] for evaluation  

## 2018-01-26 ENCOUNTER — Telehealth (HOSPITAL_COMMUNITY): Payer: Self-pay | Admitting: *Deleted

## 2018-01-26 LAB — URINE CULTURE

## 2018-01-26 NOTE — Telephone Encounter (Signed)
Preadmission screen Interpreter number [phone]

## 2018-02-01 ENCOUNTER — Ambulatory Visit (INDEPENDENT_AMBULATORY_CARE_PROVIDER_SITE_OTHER): Payer: Medicaid Other | Admitting: Obstetrics & Gynecology

## 2018-02-01 VITALS — BP 116/78 | HR 92 | Wt 232.0 lb

## 2018-02-01 DIAGNOSIS — O24410 Gestational diabetes mellitus in pregnancy, diet controlled: Secondary | ICD-10-CM

## 2018-02-01 DIAGNOSIS — O0993 Supervision of high risk pregnancy, unspecified, third trimester: Secondary | ICD-10-CM

## 2018-02-01 DIAGNOSIS — O0990 Supervision of high risk pregnancy, unspecified, unspecified trimester: Secondary | ICD-10-CM

## 2018-02-01 NOTE — Progress Notes (Signed)
   PRENATAL VISIT NOTE  Subjective:  Suzanne Little is a 37 y.o. [id number] at 3555w3d being seen today for ongoing prenatal care.  She is currently monitored for the following issues for this high-risk pregnancy and has Sickle cell trait (HCC); Supervision of high risk pregnancy, antepartum; Advanced maternal age in multigravida; Late prenatal care affecting pregnancy; History of gestational diabetes in prior pregnancy, currently pregnant; Hepatitis B affecting pregnancy, antepartum; Obesity affecting pregnancy in third trimester; and GDM (gestational diabetes mellitus) on their problem list.  Patient reports no complaints.  Contractions: Not present. Vag. Bleeding: None.  Movement: Present. Denies leaking of fluid.   The following portions of the patient's history were reviewed and updated as appropriate: allergies, current medications, past family history, past medical history, past social history, past surgical history and problem list. Problem list updated.  Objective:   Vitals:   02/01/18 1535  BP: 116/78  Pulse: 92  Weight: 232 lb (105.2 kg)    Fetal Status: Fetal Heart Rate (bpm): 154 Fundal Height: 40 cm Movement: Present     General:  Alert, oriented and cooperative. Patient is in no acute distress.  Skin: Skin is warm and dry. No rash noted.   Cardiovascular: Normal heart rate noted  Respiratory: Normal respiratory effort, no problems with respiration noted  Abdomen: Soft, gravid, appropriate for gestational age.  Pain/Pressure: Absent     Pelvic: Cervical exam deferred        Extremities: Normal range of motion.  Edema: Deep pitting, indentation remains for a short time  Mental Status: Normal mood and affect. Normal behavior. Normal judgment and thought content.   Assessment and Plan:  Pregnancy: G5P4004 at 3655w3d 1. Diet controlled gestational diabetes mellitus (GDM) in third trimester Stable CBGs. IOL scheduled on 02/05/18, instructions given.  2. Supervision of high risk  pregnancy, antepartum Term labor symptoms and general obstetric precautions including but not limited to vaginal bleeding, contractions, leaking of fluid and fetal movement were reviewed in detail with the patient. Please refer to After Visit Summary for other counseling recommendations.   Return in about 3 weeks (around 02/22/2018) for Incision check after BTL (RN visit) and 7 weeks for PP visit and postpartum 2 hr GTT.  Future Appointments  Date Time Provider Department Center  02/05/2018 12:00 AM [facility] None    Jaynie CollinsUgonna Jacquelina Hewins, MD

## 2018-02-01 NOTE — Patient Instructions (Addendum)
Labor Induction is at midnight Friday 02/05/2018  This means you leave your house at 11:40 pm on Thursday 02/04/2018.   Return to clinic for any scheduled appointments or obstetric concerns, or go to [facility] for evaluation   Labor Induction Labor induction is when steps are taken to cause a pregnant woman to begin the labor process. Most women go into labor on their own between 37 weeks and 42 weeks of the pregnancy. When this does not happen or when there is a medical need, methods may be used to induce labor. Labor induction causes a pregnant woman's uterus to contract. It also causes the cervix to soften (ripen), open (dilate), and thin out (efface). Usually, labor is not induced before 39 weeks of the pregnancy unless there is a problem with the baby or mother. Before inducing labor, your health care provider will consider a number of factors, including the following:  The medical condition of you and the baby.  How many weeks along you are.  The status of the baby's lung maturity.  The condition of the cervix.  The position of the baby.  What are the reasons for labor induction? Labor may be induced for the following reasons:  The health of the baby or mother is at risk.  The pregnancy is overdue by 1 week or more.  The water breaks but labor does not start on its own.  The mother has a health condition or serious illness, such as high blood pressure, infection, placental abruption, or diabetes.  The amniotic fluid amounts are low around the baby.  The baby is distressed.  Convenience or wanting the baby to be born on a certain date is not a reason for inducing labor. What methods are used for labor induction? Several methods of labor induction may be used, such as:  Prostaglandin medicine. This medicine causes the cervix to dilate and ripen. The medicine will also start contractions. It can be taken by mouth or by inserting a suppository into the vagina.  Inserting a thin  tube (catheter) with a balloon on the end into the vagina to dilate the cervix. Once inserted, the balloon is expanded with water, which causes the cervix to open.  Stripping the membranes. Your health care provider separates amniotic sac tissue from the cervix, causing the cervix to be stretched and causing the release of a hormone called progesterone. This may cause the uterus to contract. It is often done during an office visit. You will be sent home to wait for the contractions to begin. You will then come in for an induction.  Breaking the water. Your health care provider makes a hole in the amniotic sac using a small instrument. Once the amniotic sac breaks, contractions should begin. This may still take hours to see an effect.  Medicine to trigger or strengthen contractions. This medicine is given through an IV access tube inserted into a vein in your arm.  All of the methods of induction, besides stripping the membranes, will be done in the hospital. Induction is done in the hospital so that you and the baby can be carefully monitored. How long does it take for labor to be induced? Some inductions can take up to 2-3 days. Depending on the cervix, it usually takes less time. It takes longer when you are induced early in the pregnancy or if this is your first pregnancy. If a mother is still pregnant and the induction has been going on for 2-3 days, either the mother will  be sent home or a cesarean delivery will be needed. What are the risks associated with labor induction? Some of the risks of induction include:  Changes in fetal heart rate, such as too high, too low, or erratic.  Fetal distress.  Chance of infection for the mother and baby.  Increased chance of having a cesarean delivery.  Breaking off (abruption) of the placenta from the uterus (rare).  Uterine rupture (very rare).  When induction is needed for medical reasons, the benefits of induction may outweigh the risks. What  are some reasons for not inducing labor? Labor induction should not be done if:  It is shown that your baby does not tolerate labor.  You have had previous surgeries on your uterus, such as a myomectomy or the removal of fibroids.  Your placenta lies very low in the uterus and blocks the opening of the cervix (placenta previa).  Your baby is not in a head-down position.  The umbilical cord drops down into the birth canal in front of the baby. This could cut off the baby's blood and oxygen supply.  You have had a previous cesarean delivery.  There are unusual circumstances, such as the baby being extremely premature.  This information is not intended to replace advice given to you by your health care provider. Make sure you discuss any questions you have with your health care provider. Document Released: 11/05/2006 Document Revised: 11/22/2015 Document Reviewed: 01/13/2013 Elsevier Interactive Patient Education  2017 [employer]

## 2018-02-03 ENCOUNTER — Inpatient Hospital Stay (HOSPITAL_COMMUNITY): Payer: Medicaid Other | Admitting: Anesthesiology

## 2018-02-03 ENCOUNTER — Encounter (HOSPITAL_COMMUNITY)
Admission: AD | Disposition: A | Discharge status: Home / Self Care | Payer: Self-pay | Source: Home / Self Care | Attending: Family Medicine

## 2018-02-03 ENCOUNTER — Encounter (HOSPITAL_COMMUNITY): Payer: Self-pay

## 2018-02-03 ENCOUNTER — Other Ambulatory Visit: Payer: Self-pay

## 2018-02-03 ENCOUNTER — Inpatient Hospital Stay (HOSPITAL_COMMUNITY)
Admission: AD | Admit: 2018-02-03 | Discharge: 2018-02-04 | DRG: 797 | Disposition: A | Discharge status: Home / Self Care | Payer: Medicaid Other | Attending: Family Medicine | Admitting: Family Medicine

## 2018-02-03 DIAGNOSIS — O98419 Viral hepatitis complicating pregnancy, unspecified trimester: Secondary | ICD-10-CM

## 2018-02-03 DIAGNOSIS — Z3A39 39 weeks gestation of pregnancy: Secondary | ICD-10-CM

## 2018-02-03 DIAGNOSIS — O165 Unspecified maternal hypertension, complicating the puerperium: Secondary | ICD-10-CM | POA: Diagnosis not present

## 2018-02-03 DIAGNOSIS — Z3483 Encounter for supervision of other normal pregnancy, third trimester: Secondary | ICD-10-CM | POA: Diagnosis present

## 2018-02-03 DIAGNOSIS — O0990 Supervision of high risk pregnancy, unspecified, unspecified trimester: Secondary | ICD-10-CM

## 2018-02-03 DIAGNOSIS — Z302 Encounter for sterilization: Secondary | ICD-10-CM

## 2018-02-03 DIAGNOSIS — O24420 Gestational diabetes mellitus in childbirth, diet controlled: Secondary | ICD-10-CM | POA: Diagnosis present

## 2018-02-03 DIAGNOSIS — O09529 Supervision of elderly multigravida, unspecified trimester: Secondary | ICD-10-CM

## 2018-02-03 DIAGNOSIS — B181 Chronic viral hepatitis B without delta-agent: Secondary | ICD-10-CM | POA: Diagnosis present

## 2018-02-03 DIAGNOSIS — O9902 Anemia complicating childbirth: Secondary | ICD-10-CM | POA: Diagnosis present

## 2018-02-03 DIAGNOSIS — O99213 Obesity complicating pregnancy, third trimester: Secondary | ICD-10-CM | POA: Diagnosis present

## 2018-02-03 DIAGNOSIS — O0930 Supervision of pregnancy with insufficient antenatal care, unspecified trimester: Secondary | ICD-10-CM

## 2018-02-03 DIAGNOSIS — O9842 Viral hepatitis complicating childbirth: Secondary | ICD-10-CM | POA: Diagnosis present

## 2018-02-03 DIAGNOSIS — O99214 Obesity complicating childbirth: Secondary | ICD-10-CM | POA: Diagnosis present

## 2018-02-03 DIAGNOSIS — B1910 Unspecified viral hepatitis B without hepatic coma: Secondary | ICD-10-CM | POA: Diagnosis present

## 2018-02-03 DIAGNOSIS — D573 Sickle-cell trait: Secondary | ICD-10-CM | POA: Diagnosis present

## 2018-02-03 DIAGNOSIS — O24419 Gestational diabetes mellitus in pregnancy, unspecified control: Secondary | ICD-10-CM | POA: Diagnosis present

## 2018-02-03 HISTORY — PX: TUBAL LIGATION: SHX77

## 2018-02-03 LAB — CBC
HEMATOCRIT: 32.6 % — AB (ref 36.0–46.0)
HEMOGLOBIN: 11.3 g/dL — AB (ref 12.0–15.0)
MCH: 27.6 pg (ref 26.0–34.0)
MCHC: 34.7 g/dL (ref 30.0–36.0)
MCV: 79.5 fL (ref 78.0–100.0)
Platelets: 349 10*3/uL (ref 150–400)
RBC: 4.1 MIL/uL (ref 3.87–5.11)
RDW: 15.8 % — AB (ref 11.5–15.5)
WBC: 9.7 10*3/uL (ref 4.0–10.5)

## 2018-02-03 LAB — TYPE AND SCREEN
ABO/RH(D): B POS
Antibody Screen: NEGATIVE

## 2018-02-03 LAB — COMPREHENSIVE METABOLIC PANEL
ALBUMIN: 3.1 g/dL — AB (ref 3.5–5.0)
ALK PHOS: 141 U/L — AB (ref 38–126)
ALT: 16 U/L (ref 0–44)
AST: 26 U/L (ref 15–41)
Anion gap: 14 (ref 5–15)
BILIRUBIN TOTAL: 0.5 mg/dL (ref 0.3–1.2)
BUN: 8 mg/dL (ref 6–20)
CO2: 15 mmol/L — ABNORMAL LOW (ref 22–32)
Calcium: 9.1 mg/dL (ref 8.9–10.3)
Chloride: 105 mmol/L (ref 98–111)
Creatinine, Ser: 0.89 mg/dL (ref 0.44–1.00)
GFR calc Af Amer: >60 mL/min (ref >60)
GFR calc non Af Amer: >60 mL/min (ref >60)
GLUCOSE: 88 mg/dL (ref 70–99)
Potassium: 3.4 mmol/L — ABNORMAL LOW (ref 3.5–5.1)
Sodium: 134 mmol/L — ABNORMAL LOW (ref 135–145)
Total Protein: 6.8 g/dL (ref 6.5–8.1)

## 2018-02-03 LAB — PROTEIN / CREATININE RATIO, URINE
CREATININE, URINE: 52 mg/dL
Total Protein, Urine: <6 mg/dL

## 2018-02-03 LAB — GLUCOSE, CAPILLARY: Glucose-Capillary: 61 mg/dL — ABNORMAL LOW (ref 70–99)

## 2018-02-03 LAB — ABO/RH: ABO/RH(D): B POS

## 2018-02-03 SURGERY — LIGATION, FALLOPIAN TUBE, POSTPARTUM
Anesthesia: Spinal | Wound class: Clean Contaminated

## 2018-02-03 MED ORDER — BUPIVACAINE IN DEXTROSE 0.75-8.25 % IT SOLN
INTRATHECAL | Status: DC | PRN
  Administered 2018-02-03: 1.4 mg via INTRATHECAL

## 2018-02-03 MED ORDER — SIMETHICONE 80 MG PO CHEW: 80.0000 mg | CHEWABLE_TABLET | ORAL | Status: DC | PRN

## 2018-02-03 MED ORDER — ONDANSETRON HCL 4 MG PO TABS: 4.0000 mg | ORAL_TABLET | ORAL | Status: DC | PRN

## 2018-02-03 MED ORDER — OXYTOCIN 40 UNITS IN LACTATED RINGERS INFUSION - SIMPLE MED
2.5000 [IU]/h | INTRAVENOUS | Status: DC
  Administered 2018-02-03: 2.5 [IU]/h via INTRAVENOUS
  Filled 2018-02-03: qty 1000

## 2018-02-03 MED ORDER — FENTANYL CITRATE (PF) 100 MCG/2ML IJ SOLN: 50.0000 ug | INTRAMUSCULAR | Status: DC | PRN

## 2018-02-03 MED ORDER — HYDROXYZINE HCL 50 MG PO TABS
50.0000 mg | ORAL_TABLET | Freq: Four times a day (QID) | ORAL | Status: DC | PRN
  Filled 2018-02-03: qty 1

## 2018-02-03 MED ORDER — ONDANSETRON HCL 4 MG/2ML IJ SOLN
INTRAMUSCULAR | Status: AC
  Filled 2018-02-03: qty 2

## 2018-02-03 MED ORDER — SENNOSIDES-DOCUSATE SODIUM 8.6-50 MG PO TABS
2.0000 | ORAL_TABLET | ORAL | Status: DC
  Administered 2018-02-03: 2 via ORAL
  Filled 2018-02-03: qty 2

## 2018-02-03 MED ORDER — FAMOTIDINE IN NACL 20-0.9 MG/50ML-% IV SOLN
20.0000 mg | Freq: Once | INTRAVENOUS | Status: AC
  Administered 2018-02-03: 20 mg via INTRAVENOUS
  Filled 2018-02-03: qty 50

## 2018-02-03 MED ORDER — BUPIVACAINE HCL (PF) 0.25 % IJ SOLN
INTRAMUSCULAR | Status: AC
  Filled 2018-02-03: qty 30

## 2018-02-03 MED ORDER — BENZOCAINE-MENTHOL 20-0.5 % EX AERO: 1.0000 "application " | INHALATION_SPRAY | CUTANEOUS | Status: DC | PRN

## 2018-02-03 MED ORDER — SODIUM CHLORIDE 0.9 % IR SOLN
Status: DC | PRN
  Administered 2018-02-03: 1000 mL

## 2018-02-03 MED ORDER — PROMETHAZINE HCL 25 MG/ML IJ SOLN: 6.2500 mg | INTRAMUSCULAR | Status: DC | PRN

## 2018-02-03 MED ORDER — LACTATED RINGERS IV SOLN: 500.0000 mL | INTRAVENOUS | Status: DC | PRN

## 2018-02-03 MED ORDER — ONDANSETRON HCL 4 MG/2ML IJ SOLN: 4.0000 mg | INTRAMUSCULAR | Status: DC | PRN

## 2018-02-03 MED ORDER — OXYTOCIN BOLUS FROM INFUSION
500.0000 mL | Freq: Once | INTRAVENOUS | Status: AC
  Administered 2018-02-03: 500 mL via INTRAVENOUS

## 2018-02-03 MED ORDER — LACTATED RINGERS IV SOLN: INTRAVENOUS | Status: DC

## 2018-02-03 MED ORDER — LACTATED RINGERS IV SOLN
INTRAVENOUS | Status: DC | PRN
  Administered 2018-02-03: 15:00:00 via INTRAVENOUS

## 2018-02-03 MED ORDER — WITCH HAZEL-GLYCERIN EX PADS: 1.0000 "application " | MEDICATED_PAD | CUTANEOUS | Status: DC | PRN

## 2018-02-03 MED ORDER — ACETAMINOPHEN 325 MG PO TABS: 650.0000 mg | ORAL_TABLET | ORAL | Status: DC | PRN

## 2018-02-03 MED ORDER — LIDOCAINE HCL (PF) 1 % IJ SOLN
30.0000 mL | INTRAMUSCULAR | Status: DC | PRN
  Filled 2018-02-03: qty 30

## 2018-02-03 MED ORDER — ZOLPIDEM TARTRATE 5 MG PO TABS: 5.0000 mg | ORAL_TABLET | Freq: Every evening | ORAL | Status: DC | PRN

## 2018-02-03 MED ORDER — IBUPROFEN 600 MG PO TABS
600.0000 mg | ORAL_TABLET | Freq: Four times a day (QID) | ORAL | Status: DC
  Administered 2018-02-03 – 2018-02-04 (×4): 600 mg via ORAL
  Filled 2018-02-03 (×3): qty 1

## 2018-02-03 MED ORDER — ONDANSETRON HCL 4 MG/2ML IJ SOLN: 4.0000 mg | Freq: Four times a day (QID) | INTRAMUSCULAR | Status: DC | PRN

## 2018-02-03 MED ORDER — DIBUCAINE 1 % RE OINT: 1.0000 "application " | TOPICAL_OINTMENT | RECTAL | Status: DC | PRN

## 2018-02-03 MED ORDER — OXYCODONE-ACETAMINOPHEN 5-325 MG PO TABS: 1.0000 | ORAL_TABLET | ORAL | Status: DC | PRN

## 2018-02-03 MED ORDER — ONDANSETRON HCL 4 MG/2ML IJ SOLN
INTRAMUSCULAR | Status: DC | PRN
  Administered 2018-02-03: 4 mg via INTRAVENOUS

## 2018-02-03 MED ORDER — FENTANYL CITRATE (PF) 100 MCG/2ML IJ SOLN: 25.0000 ug | INTRAMUSCULAR | Status: DC | PRN

## 2018-02-03 MED ORDER — DIPHENHYDRAMINE HCL 25 MG PO CAPS: 25.0000 mg | ORAL_CAPSULE | Freq: Four times a day (QID) | ORAL | Status: DC | PRN

## 2018-02-03 MED ORDER — MISOPROSTOL 25 MCG QUARTER TABLET
25.0000 ug | ORAL_TABLET | ORAL | Status: DC | PRN
  Filled 2018-02-03: qty 1

## 2018-02-03 MED ORDER — OXYTOCIN 40 UNITS IN LACTATED RINGERS INFUSION - SIMPLE MED: 1.0000 m[IU]/min | INTRAVENOUS | Status: DC

## 2018-02-03 MED ORDER — SOD CITRATE-CITRIC ACID 500-334 MG/5ML PO SOLN
30.0000 mL | ORAL | Status: DC | PRN
  Administered 2018-02-03: 30 mL via ORAL
  Filled 2018-02-03: qty 15

## 2018-02-03 MED ORDER — ACETAMINOPHEN 325 MG PO TABS
650.0000 mg | ORAL_TABLET | ORAL | Status: DC | PRN
  Administered 2018-02-03 – 2018-02-04 (×2): 650 mg via ORAL
  Filled 2018-02-03 (×2): qty 2

## 2018-02-03 MED ORDER — FLEET ENEMA 7-19 GM/118ML RE ENEM: 1.0000 | ENEMA | Freq: Every day | RECTAL | Status: DC | PRN

## 2018-02-03 MED ORDER — TETANUS-DIPHTH-ACELL PERTUSSIS 5-2.5-18.5 LF-MCG/0.5 IM SUSP: 0.5000 mL | Freq: Once | INTRAMUSCULAR | Status: DC

## 2018-02-03 MED ORDER — OXYCODONE-ACETAMINOPHEN 5-325 MG PO TABS: 2.0000 | ORAL_TABLET | ORAL | Status: DC | PRN

## 2018-02-03 MED ORDER — MEASLES, MUMPS & RUBELLA VAC ~~LOC~~ INJ
0.5000 mL | INJECTION | Freq: Once | SUBCUTANEOUS | Status: DC
  Filled 2018-02-03: qty 0.5

## 2018-02-03 MED ORDER — COCONUT OIL OIL: 1.0000 "application " | TOPICAL_OIL | Status: DC | PRN

## 2018-02-03 MED ORDER — PRENATAL MULTIVITAMIN CH
1.0000 | ORAL_TABLET | Freq: Every day | ORAL | Status: DC
  Administered 2018-02-04: 1 via ORAL
  Filled 2018-02-03: qty 1

## 2018-02-03 MED ORDER — TERBUTALINE SULFATE 1 MG/ML IJ SOLN: 0.2500 mg | Freq: Once | INTRAMUSCULAR | Status: DC | PRN

## 2018-02-03 MED ORDER — OXYTOCIN 10 UNIT/ML IJ SOLN
INTRAMUSCULAR | Status: AC
  Filled 2018-02-03: qty 1

## 2018-02-03 MED ORDER — BUPIVACAINE HCL (PF) 0.25 % IJ SOLN
INTRAMUSCULAR | Status: DC | PRN
  Administered 2018-02-03: 10 mL

## 2018-02-03 SURGICAL SUPPLY — 23 items
BLADE SURG 11 STRL SS (BLADE) ×3 IMPLANT
CLOTH BEACON ORANGE TIMEOUT ST (SAFETY) ×3 IMPLANT
DRSG OPSITE POSTOP 3X4 (GAUZE/BANDAGES/DRESSINGS) ×3 IMPLANT
DURAPREP 26ML APPLICATOR (WOUND CARE) ×3 IMPLANT
GLOVE BIOGEL PI IND STRL 7.0 (GLOVE) ×1 IMPLANT
GLOVE BIOGEL PI IND STRL 7.5 (GLOVE) ×1 IMPLANT
GLOVE BIOGEL PI INDICATOR 7.0 (GLOVE) ×2
GLOVE BIOGEL PI INDICATOR 7.5 (GLOVE) ×2
GLOVE ECLIPSE 7.5 STRL STRAW (GLOVE) ×3 IMPLANT
GOWN STRL REUS W/TWL LRG LVL3 (GOWN DISPOSABLE) ×6 IMPLANT
NEEDLE HYPO 22GX1.5 SAFETY (NEEDLE) ×3 IMPLANT
NS IRRIG 1000ML POUR BTL (IV SOLUTION) ×3 IMPLANT
PACK ABDOMINAL MINOR (CUSTOM PROCEDURE TRAY) ×3 IMPLANT
PROTECTOR NERVE ULNAR (MISCELLANEOUS) ×3 IMPLANT
SPONGE LAP 4X18 X RAY DECT (DISPOSABLE) ×3 IMPLANT
SUT PLAIN 0 NONE (SUTURE) ×6 IMPLANT
SUT VIC AB 0 CT1 36 (SUTURE) ×3 IMPLANT
SUT VICRYL 0 UR6 27IN ABS (SUTURE) ×3 IMPLANT
SUT VICRYL 4-0 PS2 18IN ABS (SUTURE) ×3 IMPLANT
SYR CONTROL 10ML LL (SYRINGE) ×3 IMPLANT
TOWEL OR 17X24 6PK STRL BLUE (TOWEL DISPOSABLE) ×6 IMPLANT
TRAY FOLEY CATH SILVER 14FR (SET/KITS/TRAYS/PACK) ×3 IMPLANT
WATER STERILE IRR 1000ML POUR (IV SOLUTION) ×3 IMPLANT

## 2018-02-03 NOTE — Transfer of Care (Signed)
Immediate Anesthesia Transfer of Care Note  Patient: Suzanne Little  Procedure(s) Performed: POST PARTUM TUBAL LIGATION (N/A )  Patient Location: PACU  Anesthesia Type:Spinal  Level of Consciousness: awake  Airway & Oxygen Therapy: Patient Spontanous Breathing  Post-op Assessment: Report given to RN and Post -op Vital signs reviewed and stable  Post vital signs: stable  Last Vitals:  Vitals Value Taken Time  BP 126/82 02/03/2018  4:09 PM  Temp    Pulse 62 02/03/2018  4:12 PM  Resp 21 02/03/2018  4:12 PM  SpO2 99 % 02/03/2018  4:12 PM  Vitals shown include unvalidated device data.  Last Pain:  Vitals:   02/03/18 1446  TempSrc:   PainSc: 0-No pain         Complications: No apparent anesthesia complications

## 2018-02-03 NOTE — Progress Notes (Signed)
Patient desires permanent sterilization.  Other reversible forms of contraception were discussed with patient; she declines all other modalities. Risks of procedure discussed with patient including but not limited to: risk of regret, permanence of method, bleeding, infection, injury to surrounding organs and need for additional procedures.  Failure risk of 1-2 % with increased risk of ectopic gestation if pregnancy occurs was also discussed with patient.  Patient verbalized understanding of these risks and wants to proceed with sterilization.  Written informed consent obtained.  To OR when ready.  Federico FlakeKimberly Niles Newton, MD, MPH, ABFM Attending Physician Faculty Practice- [facility]

## 2018-02-03 NOTE — Progress Notes (Signed)
1630 CBG in PACU 61. Asymptomatic. Dr. Desmond Lopeurk notified. Apple juice given-tolerated well.

## 2018-02-03 NOTE — H&P (Signed)
**Note Suzanne-Identified via Obfuscation** LABOR AND DELIVERY ADMISSION HISTORY AND PHYSICAL NOTE  Suzanne Little is a 37 y.o. female G5P4004 with IUP at [redacted]w[redacted]d by 27 wk sono presenting for SOL. SROM at home with clear fluid at 10 am.  She reports positive fetal movement. She denies vaginal bleeding.  Prenatal History/Complications: PNC at [facility] established at 25w.  Pregnancy complications:  - A1GDM  -AMA  -Chronic Hep B  -Late PNC  -Sickle Cell Trait   Past Medical History: Past Medical History:  Diagnosis Date  . Diabetes mellitus without complication (HCC)   . Hep B complicating pregnancy     Past Surgical History: History reviewed. No pertinent surgical history.  Obstetrical History: OB History    Gravida  5   Para  4   Term  4   Preterm  0   AB  0   Living  4     SAB  0   TAB  0   Ectopic  0   Multiple      Live Births  4           Social History: Social History   Socioeconomic History  . Marital status: Married    Spouse name: Not on file  . Number of children: Not on file  . Years of education: Not on file  . Highest education level: Not on file  Occupational History  . Not on file  Social Needs  . Financial resource strain: Not on file  . Food insecurity:    Worry: Not on file    Inability: Not on file  . Transportation needs:    Medical: Not on file    Non-medical: Not on file  Tobacco Use  . Smoking status: Never Smoker  . Smokeless tobacco: Never Used  Substance and Sexual Activity  . Alcohol use: No  . Drug use: No  . Sexual activity: Yes  Lifestyle  . Physical activity:    Days per week: Not on file    Minutes per session: Not on file  . Stress: Not on file  Relationships  . Social connections:    Talks on phone: Not on file    Gets together: Not on file    Attends religious service: Not on file    Active member of club or organization: Not on file    Attends meetings of clubs or organizations: Not on file    Relationship status: Not on file  Other Topics  Concern  . Not on file  Social History Narrative   ** Merged History Encounter **        Family History: History reviewed. No pertinent family history.  Allergies: No Known Allergies  Medications Prior to Admission  Medication Sig Dispense Refill Last Dose  . ACCU-CHEK FASTCLIX LANCETS MISC Please use to check blood sugars four times a day. 100 each 12 Taking  . glucose blood test strip Check sugar 4 times a day 250.00 100 each 12 Taking  . Prenatal Vit-Fe Fumarate-FA (PRENATAL VITAMINS) 28-0.8 MG TABS Take 1 tablet by mouth daily.  11 Taking     Review of Systems  All systems reviewed and negative except as stated in HPI  Physical Exam Blood pressure (!) 144/90, pulse 87, temperature 97.7 F (36.5 C), temperature source Oral, resp. rate 20, height 5\' 5"  (1.651 m), weight 240 lb (108.9 kg), last menstrual period 05/29/2017, SpO2 100 %, currently breastfeeding. General appearance: alert, oriented, NAD Lungs: normal respiratory effort Heart: regular rate Abdomen: soft, non-tender; gravid, FH  appropriate for GA Extremities: No calf swelling or tenderness Presentation: cephalic Dilation: 10 Effacement (%): 90 Station: Plus 2 Exam by:: Viona GilmoreS moyer RN  Prenatal labs: ABO, Rh: B/Positive/-- (05/01 1400) Antibody: Negative (05/01 1400) Rubella: 10.20 (05/01 1400) RPR: Non Reactive (05/30 0912)  HBsAg: Confirm. indicated (05/01 1400)  HIV: Non Reactive (05/30 0912)  GC/Chlamydia: Negative  GBS: Negative (07/15 1500)  2-hr GTT: Abnormal, HgB A1c 5.7%  Genetic screening:  Low risk  Anatomy US: Normal but limited views, f/u normal   Prenatal Transfer Tool  Maternal Diabetes: Yes:  Diabetes Type:  Diet controlled Genetic Screening: Normal Maternal Ultrasounds/Referrals: Normal Fetal Ultrasounds or other Referrals:  None Maternal Substance Abuse:  No Significant Maternal Medications:  None Significant Maternal Lab Results: Lab values include: Group B Strep negative  No  results found for this or any previous visit (from the past 24 hour(s)).  Patient Active Problem List   Diagnosis Date Noted  . Labor and delivery indication for care or intervention 02/03/2018  . GDM (gestational diabetes mellitus) 12/10/2017  . Obesity affecting pregnancy in third trimester   . Supervision of high risk pregnancy, antepartum 10/28/2017  . Advanced maternal age in multigravida 10/28/2017  . Late prenatal care affecting pregnancy 10/28/2017  . History of gestational diabetes in prior pregnancy, currently pregnant 10/28/2017  . Hepatitis B affecting pregnancy, antepartum 10/28/2017  . Sickle cell trait (HCC) 11/19/2012    Assessment: Suzanne Little is a 37 y.o. L2G4010G5P4004 at 6974w5d here for SOL/SROM.   #Labor: Precipitous delivery from time of arrival at [facility].  #ID:  GBS neg  #MOF: Breast/bottle  #MOC:BTL PP planned for today   Suzanne Little 02/03/2018, 1:18 PM

## 2018-02-03 NOTE — Anesthesia Preprocedure Evaluation (Addendum)
Anesthesia Evaluation  Patient identified by MRN, date of birth, ID band Patient awake    Reviewed: Allergy & Precautions, NPO status , Patient's Chart, lab work & pertinent test results  Airway Mallampati: III  TM Distance: >3 FB Neck ROM: Full    Dental  (+) Teeth Intact, Dental Advisory Given   Pulmonary neg pulmonary ROS,    Pulmonary exam normal breath sounds clear to auscultation       Cardiovascular negative cardio ROS Normal cardiovascular exam Rhythm:Regular Rate:Normal     Neuro/Psych negative neurological ROS     GI/Hepatic negative GI ROS, (+) Hepatitis -, B  Endo/Other  diabetes, GestationalMorbid obesity  Renal/GU negative Renal ROS     Musculoskeletal negative musculoskeletal ROS (+)   Abdominal   Peds  Hematology  (+) Blood dyscrasia, Sickle cell trait and anemia , Plt 349k   Anesthesia Other Findings Day of surgery medications reviewed with the patient.  Reproductive/Obstetrics                            Anesthesia Physical Anesthesia Plan  ASA: III  Anesthesia Plan: Spinal   Post-op Pain Management:    Induction:   PONV Risk Score and Plan: 2 and Treatment may vary due to age or medical condition  Airway Management Planned: Natural Airway  Additional Equipment:   Intra-op Plan:   Post-operative Plan:   Informed Consent: I have reviewed the patients History and Physical, chart, labs and discussed the procedure including the risks, benefits and alternatives for the proposed anesthesia with the patient or authorized representative who has indicated his/her understanding and acceptance.   Dental advisory given  Plan Discussed with: CRNA, Anesthesiologist and Surgeon  Anesthesia Plan Comments: (Discussed risks and benefits of and differences between spinal and general. Discussed risks of spinal including headache, backache, failure, bleeding, infection, and  nerve damage. Patient consents to spinal. Questions answered. Coagulation studies and platelet count acceptable.)        Anesthesia Quick Evaluation

## 2018-02-03 NOTE — Op Note (Signed)
Suzanne Little 02/03/2018  PREOPERATIVE DIAGNOSIS:  Multiparity, undesired fertility  POSTOPERATIVE DIAGNOSIS:  Multiparity, undesired fertility  PROCEDURE:  Postpartum Bilateral Tubal Sterilization using Pomeroy method  ANESTHESIA:  Spinal and local analgesia using 0.5% Marcaine  COMPLICATIONS:  None immediate.  ESTIMATED BLOOD LOSS: 5 ml.  URINE OUTPUT: Foley after case.  INDICATIONS: 37 y.o. [id number]  with undesired fertility,status post vaginal delivery, desires permanent sterilization.  Other reversible forms of contraception were discussed with patient; she declines all other modalities. Risks of procedure discussed with patient including but not limited to: risk of regret, permanence of method, bleeding, infection, injury to surrounding organs and need for additional procedures.  Failure risk of 0.5-1% with increased risk of ectopic gestation if pregnancy occurs was also discussed with patient.     FINDINGS:  Normal uterus, tubes, and ovaries.  PROCEDURE DETAILS: The patient was taken to the operating room where her epidural anesthesia was dosed up to surgical level and found to be adequate.  She was then placed in the dorsal supine position and prepped and draped in sterile fashion.  After an adequate timeout was performed, attention was turned to the patient's abdomen where a small transverse skin incision was made under the umbilical fold. The incision was taken down to the layer of fascia using the scalpel, and fascia was incised, and extended bilaterally using Mayo scissors. The peritoneum was entered in a sharp fashion. Attention was then turned to the patient's uterus, and right fallopian tube was identified and followed out to the fimbriated end.  Approximately 2 cm was elevated and two ties were placed with 0-chromic 3 cm from the cornual attachmen. A 1 cm section of right fallopian tube was excised with care given to incorporate the underlying mesosalpinx.  A similar process was  carried out on the left side allowing for bilateral tubal sterilization. Of note the original tie slipped after excision of tube and an additional two ties were placed to assure hemostasis. Good hemostasis was noted overall.  The instruments were then removed from the patient's abdomen and the fascial incision was repaired with 0 Vicryl, and the skin was closed with a 4-0 Vicryl subcuticular stitch. 10cc of 0.5% Marcaine was injected into the incision. The patient tolerated the procedure well.  Instrument, sponge, and needle counts were correct times two.  The patient was then taken to the recovery room awake and in stable condition.

## 2018-02-03 NOTE — Anesthesia Procedure Notes (Signed)
Spinal  Patient location during procedure: OR Start time: 02/03/2018 3:08 PM End time: 02/03/2018 3:11 PM Staffing Anesthesiologist: Cecile Hearingurk, Stephen Edward, MD Performed: anesthesiologist  Preanesthetic Checklist Completed: patient identified, surgical consent, pre-op evaluation, timeout performed, IV checked, risks and benefits discussed and monitors and equipment checked Spinal Block Patient position: sitting Prep: site prepped and draped and DuraPrep Patient monitoring: continuous pulse ox and blood pressure Approach: midline Location: L3-4 Injection technique: single-shot Needle Needle type: Pencan  Needle gauge: 24 G Assessment Sensory level: T6 Additional Notes Functioning IV was confirmed and monitors were applied. Sterile prep and drape, including hand hygiene, mask and sterile gloves were used. The patient was positioned and the spine was prepped. The skin was anesthetized with lidocaine.  Free flow of clear CSF was obtained prior to injecting local anesthetic into the CSF.  The spinal needle aspirated freely following injection.  The needle was carefully withdrawn.  The patient tolerated the procedure well. Consent was obtained prior to procedure with all questions answered and concerns addressed. Risks including but not limited to bleeding, infection, nerve damage, paralysis, failed block, inadequate analgesia, allergic reaction, high spinal, itching and headache were discussed and the patient wished to proceed.   Arrie AranStephen Turk, MD

## 2018-02-04 ENCOUNTER — Ambulatory Visit: Payer: Self-pay

## 2018-02-04 DIAGNOSIS — O165 Unspecified maternal hypertension, complicating the puerperium: Secondary | ICD-10-CM | POA: Diagnosis not present

## 2018-02-04 LAB — GLUCOSE, CAPILLARY: GLUCOSE-CAPILLARY: 91 mg/dL (ref 70–99)

## 2018-02-04 LAB — RPR: RPR Ser Ql: NONREACTIVE

## 2018-02-04 MED ORDER — NIFEDIPINE ER 30 MG PO TB24
30.0000 mg | ORAL_TABLET | Freq: Every day | ORAL | 1 refills | Status: DC
Start: 2018-02-05 — End: 2018-03-26

## 2018-02-04 MED ORDER — NIFEDIPINE ER OSMOTIC RELEASE 30 MG PO TB24
30.0000 mg | ORAL_TABLET | Freq: Every day | ORAL | Status: DC
  Administered 2018-02-04: 30 mg via ORAL
  Filled 2018-02-04: qty 1

## 2018-02-04 MED ORDER — OXYCODONE-ACETAMINOPHEN 5-325 MG PO TABS: 1.0000 | ORAL_TABLET | ORAL | 0 refills | Status: AC | PRN

## 2018-02-04 MED ORDER — IBUPROFEN 600 MG PO TABS: 600.0000 mg | ORAL_TABLET | Freq: Four times a day (QID) | ORAL | 0 refills | Status: DC | PRN

## 2018-02-04 MED ORDER — OXYCODONE-ACETAMINOPHEN 5-325 MG PO TABS: 1.0000 | ORAL_TABLET | ORAL | 0 refills | Status: DC | PRN

## 2018-02-04 NOTE — Discharge Summary (Signed)
OB Discharge Summary     Patient Name: Suzanne BlueKanle Casselman DOB: 12/03/1980 MRN: [id number]  Date of admission: 02/03/2018 Delivering MD: Tamera StandsWALLACE, LAUREL S   Date of discharge: 02/04/2018  Admitting diagnosis: LABOR Intrauterine pregnancy: 5043w5d     Secondary diagnosis:  Active Problems:   Sickle cell trait (HCC)   Supervision of high risk pregnancy, antepartum   Advanced maternal age in multigravida   Late prenatal care affecting pregnancy   Hepatitis B affecting pregnancy, antepartum   Obesity affecting pregnancy in third trimester   GDM (gestational diabetes mellitus)   Labor and delivery indication for care or intervention   Postpartum hypertension  Additional problems: none     Discharge diagnosis: Term Pregnancy Delivered, GDM A1 and postpartum hypertension                                                                                                Post partum procedures:postpartum tubal ligation  Augmentation: none  Complications: None  Hospital course:  Onset of Labor With Vaginal Delivery     37 y.o. yo G5P5005 at 743w5d was admitted in Active Labor on 02/03/2018. Patient had an uncomplicated, precipitous labor course as follows:  Membrane Rupture Time/Date: 11:00 AM ,02/03/2018   Intrapartum Procedures: Episiotomy: None [1]                                         Lacerations:  None [1]  Patient had a delivery of a Viable infant. 02/03/2018  Information for the patient's newborn:  Frazier ButtKouevi, Boy Miaya [[id number]][[id number]]  Delivery Method: Vag-Spont    Pateint had a postpartum course remarkable for having a ppBTL on PPD#0. She tolerated the procedure well. On PPD#1 she was started on Procardia 30XL due to some elevated BPs. Pre-e labs were collected after delivery and were negative.  She is ambulating, tolerating a regular diet, passing flatus, and urinating well. Patient is discharged home in stable condition on 02/04/18 per her request for early d/c if baby is released by  peds.   Physical exam  Vitals:   02/03/18 1857 02/03/18 2230 02/04/18 0300 02/04/18 0616  BP: (!) 146/77 117/80 (!) 151/96 121/88  Pulse: 62 86 76 68  Resp: 16 16 18 18   Temp: 97.9 F (36.6 C) 98.4 F (36.9 C) 98.3 F (36.8 C) 98.2 F (36.8 C)  TempSrc: Oral Oral Oral Oral  SpO2: 100% 100% 100% 99%  Weight:      Height:       General: alert and cooperative Lochia: appropriate Uterine Fundus: firm Incision: BTL incision dry/intact DVT Evaluation: No evidence of DVT seen on physical exam. Labs: Lab Results  Component Value Date   WBC 9.7 02/03/2018   HGB 11.3 (L) 02/03/2018   HCT 32.6 (L) 02/03/2018   MCV 79.5 02/03/2018   PLT 349 02/03/2018   CMP Latest Ref Rng & Units 02/03/2018  Glucose 70 - 99 mg/dL 88  BUN 6 - 20 mg/dL 8  Creatinine 7.820.44 - 9.561.00 mg/dL 2.130.89  Sodium 135 - 145 mmol/L 134(L)  Potassium 3.5 - 5.1 mmol/L 3.4(L)  Chloride 98 - 111 mmol/L 105  CO2 22 - 32 mmol/L 15(L)  Calcium 8.9 - 10.3 mg/dL 9.1  Total Protein 6.5 - 8.1 g/dL 6.8  Total Bilirubin 0.3 - 1.2 mg/dL 0.5  Alkaline Phos 38 - 126 U/L 141(H)  AST 15 - 41 U/L 26  ALT 0 - 44 U/L 16    Discharge instruction: per After Visit Summary and "Baby and Me Booklet".  Allergies as of 02/04/2018   No Known Allergies     Medication List    STOP taking these medications   ACCU-CHEK FASTCLIX LANCETS Misc   glucose blood test strip     TAKE these medications   ibuprofen 600 MG tablet Commonly known as:  ADVIL,MOTRIN Take 1 tablet (600 mg total) by mouth every 6 (six) hours as needed.   NIFEdipine 30 MG 24 hr tablet Commonly known as:  PROCARDIA-XL/ADALAT CC Take 1 tablet (30 mg total) by mouth daily. Start taking on:  02/05/2018   oxyCODONE-acetaminophen 5-325 MG tablet Commonly known as:  PERCOCET/ROXICET Take 1 tablet by mouth every 4 (four) hours as needed for severe pain.   Prenatal Vitamins 28-0.8 MG Tabs Take 1 tablet by mouth daily.       Diet: routine diet  Activity: Advance  as tolerated. Pelvic rest for 6 weeks.   Outpatient follow up:1 week BP check; 6 wk PP visit with GTT Follow up Appt: Future Appointments  Date Time Provider Department Center  02/26/2018 11:15 AM [facility] NURSE [facility]  03/26/2018  8:45 AM Anyanwu, Jethro Bastos, MD [facility]   Follow up Visit:No follow-ups on file.  Postpartum contraception: Tubal Ligation- inpt  Newborn Data: Live born female  Birth Weight: 7 lb 11.5 oz (3501 g) APGAR: 9, 9  Newborn Delivery   Birth date/time:  02/03/2018 12:38:00 Delivery type:  Vaginal, Spontaneous     Baby Feeding: Breast Disposition:home with mother   02/04/2018 Cam Hai, CNM  9:55 AM

## 2018-02-04 NOTE — Lactation Note (Signed)
This note was copied from a baby's chart. Lactation Consultation Note: Mother reports that infant is feeding well. She is bottle and breastfeeding. Discussed treatment and prevention of engorgement. Advised mother to continue to cue base feed and feed infant 8-12 times in 24 hours.  Mother informed of WH, LC services, BFSG, outpatient services and phone line 24/7  For breastfeeding questions or concerns. Mother receptive to all  Teaching.   Patient Name: Boy Krista BlueKanle Soley ZOXWR'UToday's Date: 02/04/2018 Reason for consult: Follow-up assessment   Maternal Data    Feeding Feeding Type: Bottle Fed - Formula  LATCH Score                   Interventions Interventions: Breast massage;Ice  Lactation Tools Discussed/Used     Consult Status Consult Status: Complete    Michel BickersKendrick, Sanjit Mcmichael McCoy 02/04/2018, 3:03 PM

## 2018-02-04 NOTE — Discharge Instructions (Signed)
Postpartum Tubal Ligation, Care After Refer to this sheet in the next few weeks. These instructions provide you with information about caring for yourself after your procedure. Your health care provider may also give you more specific instructions. Your treatment has been planned according to current medical practices, but problems sometimes occur. Call your health care provider if you have any problems or questions after your procedure. What can I expect after the procedure? After the procedure, it is common to have:  A sore throat.  Bruising or pain in your back.  Nausea or vomiting.  Dizziness.  Mild abdominal discomfort or pain, such as cramping, gas pain, or feeling bloated.  Soreness where the incision was made.  Tiredness.  Pain in your shoulders.  Follow these instructions at home: Medicines  Take over-the-counter and prescription medicines only as told by your health care provider.  Do not take aspirin because it can cause bleeding.  Do not drive or operate heavy machinery while taking prescription pain medicine. Activity  Rest for the rest of the day.  Gradually return to your normal activities over the next few days.  Do not have sex, douche, or put a tampon or anything else in your vagina for 6 weeks or as long as told by your health care provider.  Do not lift anything that is heavier than your baby for 2 weeks or as long as told by your health care provider. Incision care  Follow instructions from your health care provider about how to take care of your incision. Make sure you: ? Wash your hands with soap and water before you change your bandage (dressing). If soap and water are not available, use hand sanitizer. ? Change your dressing as told by your health care provider. ? Leave stitches (sutures) in place. They may need to stay in place for 2 weeks or longer.  Check your incision area every day for signs of infection. Check for: ? More redness, swelling,  or pain. ? More fluid or blood. ? Warmth. ? Pus or a bad smell. Other Instructions  Do not take baths, swim, or use a hot tub until your health care provider approves. You may take showers.  Keep all follow-up visits as told by your health care provider. This is important. Contact a health care provider if:  You have more redness, swelling, or pain around your incision.  Your incision feels warm to the touch.  You have pus or a bad smell coming from your incision.  The edges of your incision break open after the sutures have been removed.  Your pain does not improve after 2-3 days.  You have a rash.  You repeatedly become dizzy or lightheaded.  Your pain medicine is not helping.  You are constipated. Get help right away if:  You have a fever.  You faint.  You have pain in your abdomen that gets worse.  You have fluid or blood coming from your sutures.  You have shortness of breath or difficulty breathing.  You have chest pain or leg pain.  You have ongoing nausea or diarrhea. This information is not intended to replace advice given to you by your health care provider. Make sure you discuss any questions you have with your health care provider. Document Released: 12/16/2011 Document Revised: 11/19/2015 Document Reviewed: 05/27/2015 Elsevier Interactive Patient Education  2018 [employer]. Postpartum Hypertension Postpartum hypertension is high blood pressure after pregnancy that remains higher than normal for more than two days after delivery. You may  not realize that you have postpartum hypertension if your blood pressure is not being checked regularly. In some cases, postpartum hypertension will go away on its own, usually within a week of delivery. However, for some women, medical treatment is required to prevent serious complications, such as seizures or stroke. The following things can affect your blood pressure:  The type of delivery you had.  Having  received IV fluids or other medicines during or after delivery.  What are the causes? Postpartum hypertension may be caused by any of the following or by a combination of any of the following:  Hypertension that existed before pregnancy (chronic hypertension).  Gestational hypertension.  Preeclampsia or eclampsia.  Receiving a lot of fluid through an IV during or after delivery.  Medicines.  HELLP syndrome.  Hyperthyroidism.  Stroke.  Other rare neurological or blood disorders.  In some cases, the cause may not be known. What increases the risk? Postpartum hypertension can be related to one or more risk factors, such as:  Chronic hypertension. In some cases, this may not have been diagnosed before pregnancy.  Obesity.  Type 2 diabetes.  Kidney disease.  Family history of preeclampsia.  Other medical conditions that cause hormonal imbalances.  What are the signs or symptoms? As with all types of hypertension, postpartum hypertension may not have any symptoms. Depending on how high your blood pressure is, you may experience:  Headaches. These may be mild, moderate, or severe. They may also be steady, constant, or sudden in onset (thunderclap headache).  Visual changes.  Dizziness.  Shortness of breath.  Swelling of your hands, feet, lower legs, or face. In some cases, you may have swelling in more than one of these locations.  Heart palpitations or a racing heartbeat.  Difficulty breathing while lying down.  Decreased urination.  Other rare signs and symptoms may include:  Sweating more than usual. This lasts longer than a few days after delivery.  Chest pain.  Sudden dizziness when you get up from sitting or lying down.  Seizures.  Nausea or vomiting.  Abdominal pain.  How is this diagnosed? The diagnosis of postpartum hypertension is made through a combination of physical examination findings and testing of your blood and urine. You may also  have additional tests, such as a CT scan or an MRI, to check for other complications of postpartum hypertension. How is this treated? When blood pressure is high enough to require treatment, your options may include:  Medicines to reduce blood pressure (antihypertensives). Tell your health care provider if you are breastfeeding or if you plan to breastfeed. There are many antihypertensive medicines that are safe to take while breastfeeding.  Stopping medicines that may be causing hypertension.  Treating medical conditions that are causing hypertension.  Treating the complications of hypertension, such as seizures, stroke, or kidney problems.  Your health care provider will also continue to monitor your blood pressure closely and repeatedly until it is within a safe range for you. Follow these instructions at home:  Take medicines only as directed by your health care provider.  Get regular exercise after your health care provider tells you that it is safe.  Follow your health care providers recommendations on fluid and salt restrictions.  Do not use any tobacco products, including cigarettes, chewing tobacco, or electronic cigarettes. If you need help quitting, ask your health care provider.  Keep all follow-up visits as directed by your health care provider. This is important. Contact a health care provider if:  Your  symptoms get worse.  You have new symptoms, such as: ? Headache. ? Dizziness. ? Visual changes. Get help right away if:  You develop a severe or sudden headache.  You have seizures.  You develop numbness or weakness on one side of your body.  You have difficulty thinking, speaking, or swallowing.  You develop severe abdominal pain.  You develop difficulty breathing, chest pain, a racing heartbeat, or heart palpitations. These symptoms may represent a serious problem that is an emergency. Do not wait to see if the symptoms will go away. Get medical help right  away. Call your local emergency services (911 in the [place] Do not drive yourself to the hospital. This information is not intended to replace advice given to you by your health care provider. Make sure you discuss any questions you have with your health care provider. Document Released: 02/17/2014 Document Revised: 11/19/2015 Document Reviewed: 12/29/2013 Elsevier Interactive Patient Education  2018 [employer] Vaginal Delivery, Care After Refer to this sheet in the next few weeks. These instructions provide you with information about caring for yourself after vaginal delivery. Your health care provider may also give you more specific instructions. Your treatment has been planned according to current medical practices, but problems sometimes occur. Call your health care provider if you have any problems or questions. What can I expect after the procedure? After vaginal delivery, it is common to have:  Some bleeding from your vagina.  Soreness in your abdomen, your vagina, and the area of skin between your vaginal opening and your anus (perineum).  Pelvic cramps.  Fatigue.  Follow these instructions at home: Medicines  Take over-the-counter and prescription medicines only as told by your health care provider.  If you were prescribed an antibiotic medicine, take it as told by your health care provider. Do not stop taking the antibiotic until it is finished. Driving   Do not drive or operate heavy machinery while taking prescription pain medicine.  Do not drive for 24 hours if you received a sedative. Lifestyle  Do not drink alcohol. This is especially important if you are breastfeeding or taking medicine to relieve pain.  Do not use tobacco products, including cigarettes, chewing tobacco, or e-cigarettes. If you need help quitting, ask your health care provider. Eating and drinking  Drink at least 8 eight-ounce glasses of water every day unless you are told not to by your health  care provider. If you choose to breastfeed your baby, you may need to drink more water than this.  Eat high-fiber foods every day. These foods may help prevent or relieve constipation. High-fiber foods include: ? Whole grain cereals and breads. ? Brown rice. ? Beans. ? Fresh fruits and vegetables. Activity  Return to your normal activities as told by your health care provider. Ask your health care provider what activities are safe for you.  Rest as much as possible. Try to rest or take a nap when your baby is sleeping.  Do not lift anything that is heavier than your baby or 10 lb (4.5 kg) until your health care provider says that it is safe.  Talk with your health care provider about when you can engage in sexual activity. This may depend on your: ? Risk of infection. ? Rate of healing. ? Comfort and desire to engage in sexual activity. Vaginal Care  If you have an episiotomy or a vaginal tear, check the area every day for signs of infection. Check for: ? More redness, swelling, or pain. ?  More fluid or blood. ? Warmth. ? Pus or a bad smell.  Do not use tampons or douches until your health care provider says this is safe.  Watch for any blood clots that may pass from your vagina. These may look like clumps of dark red, brown, or black discharge. General instructions  Keep your perineum clean and dry as told by your health care provider.  Wear loose, comfortable clothing.  Wipe from front to back when you use the toilet.  Ask your health care provider if you can shower or take a bath. If you had an episiotomy or a perineal tear during labor and delivery, your health care provider may tell you not to take baths for a certain length of time.  Wear a bra that supports your breasts and fits you well.  If possible, have someone help you with household activities and help care for your baby for at least a few days after you leave the hospital.  Keep all follow-up visits for you  and your baby as told by your health care provider. This is important. Contact a health care provider if:  You have: ? Vaginal discharge that has a bad smell. ? Difficulty urinating. ? Pain when urinating. ? A sudden increase or decrease in the frequency of your bowel movements. ? More redness, swelling, or pain around your episiotomy or vaginal tear. ? More fluid or blood coming from your episiotomy or vaginal tear. ? Pus or a bad smell coming from your episiotomy or vaginal tear. ? A fever. ? A rash. ? Little or no interest in activities you used to enjoy. ? Questions about caring for yourself or your baby.  Your episiotomy or vaginal tear feels warm to the touch.  Your episiotomy or vaginal tear is separating or does not appear to be healing.  Your breasts are painful, hard, or turn red.  You feel unusually sad or worried.  You feel nauseous or you vomit.  You pass large blood clots from your vagina. If you pass a blood clot from your vagina, save it to show to your health care provider. Do not flush blood clots down the toilet without having your health care provider look at them.  You urinate more than usual.  You are dizzy or light-headed.  You have not breastfed at all and you have not had a menstrual period for 12 weeks after delivery.  You have stopped breastfeeding and you have not had a menstrual period for 12 weeks after you stopped breastfeeding. Get help right away if:  You have: ? Pain that does not go away or does not get better with medicine. ? Chest pain. ? Difficulty breathing. ? Blurred vision or spots in your vision. ? Thoughts about hurting yourself or your baby.  You develop pain in your abdomen or in one of your legs.  You develop a severe headache.  You faint.  You bleed from your vagina so much that you fill two sanitary pads in one hour. This information is not intended to replace advice given to you by your health care provider. Make sure  you discuss any questions you have with your health care provider. Document Released: 06/13/2000 Document Revised: 11/28/2015 Document Reviewed: 07/01/2015 Elsevier Interactive Patient Education  2018 [employer].

## 2018-02-04 NOTE — Lactation Note (Signed)
This note was copied from a baby's chart. Lactation Consultation Note  Patient Name: Suzanne Little WUJWJ'XToday's Date: 02/04/2018 Reason for consult: Initial assessment;Term P5, 11 hour female infant Mom is experienced BF , previous BF other 4 children from 2 to 1/2 years. [facility] [place] Per mom, her feeding plan is breast and bottle feeding her baby.  Per mom, been giving a lot formula 4 times since birth was waiting for her milk to come. LC had mom to hand express and mom saw that she had  Colostrum, mom  was please to see that she had breast milk for her baby.  Discussed w/ mom importance of BF and putting baby to breast and that colostrum is good for baby. The more mom puts baby to breast it  will helps with breast stimulation /induction and establish  her milk supply. LC did not observe latch, Mom had given infant 10 ml of formula a few minutes before LC entered room.  Per mom, she now plans to latch baby to breast more frequently and give her baby less formula.  Mom encouraged to feed baby 8-12 times/24 hours and with feeding cues.  Hand pump (harmony) given prn.  Mom shown how to use hand pump  & how to disassemble, clean, & reassemble parts. LC discussed I&O. Reviewed Baby & Me book's Breastfeeding Basics.  Mom made aware of O/P services, breastfeeding support groups, community resources, and our phone # for post-discharge questions.  Maternal Data Formula Feeding for Exclusion: No Has patient been taught Hand Expression?: Yes(Taught by LC) Does the patient have breastfeeding experience prior to this delivery?: Yes  Feeding Feeding Type: Breast Fed  LATCH Score                   Interventions Interventions: Breast feeding basics reviewed;Skin to skin;Breast massage;Hand express;Hand pump  Lactation Tools Discussed/Used WIC Program: Yes Pump Review: Setup, frequency, and cleaning;Milk Storage Initiated by:: Suzanne Little, IBCLC Date initiated::  02/04/18   Consult Status      Suzanne Little 02/04/2018, 12:30 AM

## 2018-02-04 NOTE — Anesthesia Postprocedure Evaluation (Signed)
Anesthesia Post Note  Patient: Krista BlueKanle Leitz  Procedure(s) Performed: POST PARTUM TUBAL LIGATION (N/A )     Patient location during evaluation: Mother Baby Anesthesia Type: Spinal Level of consciousness: awake, awake and alert and oriented Pain management: pain level controlled Vital Signs Assessment: post-procedure vital signs reviewed and stable Respiratory status: spontaneous breathing, nonlabored ventilation and respiratory function stable Postop Assessment: no headache, no backache, adequate PO intake and able to ambulate Anesthetic complications: no    Last Vitals:  Vitals:   02/04/18 0300 02/04/18 0616  BP: (!) 151/96 121/88  Pulse: 76 68  Resp: 18 18  Temp: 36.8 C 36.8 C  SpO2: 100% 99%    Last Pain:  Vitals:   02/04/18 0616  TempSrc: Oral  PainSc:    Pain Goal:                 Madison HickmanGREGORY,Marvina Danner

## 2018-02-05 ENCOUNTER — Inpatient Hospital Stay (HOSPITAL_COMMUNITY): Admission: RE | Admit: 2018-02-05 | Payer: Medicaid Other | Source: Ambulatory Visit

## 2018-02-15 ENCOUNTER — Telehealth: Payer: Self-pay

## 2018-02-15 NOTE — Telephone Encounter (Signed)
Received call from baby love nurse. Patient blood pressure is reading high today. 142/96 right arm she reports having blurred vision and a headache today.  Patient reports not taking her medications last night. I have advised the nurse to see if patient will go ahead and take her Procardia and tylenol to help with pain and try and rest. Then recheck her blood pressure in a couple hours. If still reading high and headache has not went away. She should report to [facility] to be check out. Nurse  Voice understanding and will advised patient.

## 2018-02-26 ENCOUNTER — Ambulatory Visit (INDEPENDENT_AMBULATORY_CARE_PROVIDER_SITE_OTHER): Payer: Medicaid Other

## 2018-02-26 VITALS — BP 120/84 | HR 86 | Resp 16 | Ht 65.5 in | Wt 210.8 lb

## 2018-02-26 DIAGNOSIS — O165 Unspecified maternal hypertension, complicating the puerperium: Secondary | ICD-10-CM

## 2018-02-26 DIAGNOSIS — Z4889 Encounter for other specified surgical aftercare: Secondary | ICD-10-CM

## 2018-02-26 NOTE — Progress Notes (Signed)
Subjective:  Suzanne Little is a 37 y.o. female here for BP check and incision check for tubal ligation. Patient reports she is having no pain or complications since delivery baby and having tubal ligation. Patient reports she is just feeling very fatigue from the delivery and taking care of the baby.  Incision check: Navel - Area/site/ skin is dry; no drainage, swelling or irritation at site.   Hypertension ROS: taking medications as instructed, no medication side effects noted, no TIA's, no chest pain on exertion, no dyspnea on exertion and noting swelling of ankles. 1+ Edema of feet/ankles. Dorsal pedal pulses were WNL.   Objective:  BP 120/84 (BP Location: Left Arm, Patient Position: Sitting, Cuff Size: Normal)   Pulse 86   Resp 16   Ht 5' 5.5" (1.664 m)   Wt 210 lb 12.8 oz (95.6 kg)   Breastfeeding? Yes Comment: Bottle fedding as well  BMI 34.55 kg/m    Appearance alert, well appearing, and in no distress, oriented to person, place, and time.  General exam BP noted to be well controlled today in office.    Assessment:   Blood Pressure well controlled.   Plan:  Current treatment plan is effective, no change in therapy..Marland Kitchen

## 2018-03-03 NOTE — Progress Notes (Signed)
I have reviewed the chart and agree with nursing staff's documentation of this patient's encounter.  [place] Bing, MD 03/03/2018 11:36 AM

## 2018-03-17 ENCOUNTER — Ambulatory Visit: Payer: Self-pay | Admitting: Advanced Practice Midwife

## 2018-03-26 ENCOUNTER — Encounter: Payer: Self-pay | Admitting: Obstetrics & Gynecology

## 2018-03-26 ENCOUNTER — Ambulatory Visit (INDEPENDENT_AMBULATORY_CARE_PROVIDER_SITE_OTHER): Payer: Medicaid Other | Admitting: Obstetrics & Gynecology

## 2018-03-26 DIAGNOSIS — Z1389 Encounter for screening for other disorder: Secondary | ICD-10-CM

## 2018-03-26 DIAGNOSIS — Z8632 Personal history of gestational diabetes: Secondary | ICD-10-CM | POA: Insufficient documentation

## 2018-03-26 DIAGNOSIS — O165 Unspecified maternal hypertension, complicating the puerperium: Secondary | ICD-10-CM

## 2018-03-26 LAB — COMPREHENSIVE METABOLIC PANEL
ALK PHOS: 122 IU/L — AB (ref 39–117)
ALT: 28 IU/L (ref 0–32)
AST: 22 IU/L (ref 0–40)
Albumin/Globulin Ratio: 1.5 (ref 1.2–2.2)
Albumin: 3.8 g/dL (ref 3.5–5.5)
BUN/Creatinine Ratio: 10 (ref 9–23)
BUN: 8 mg/dL (ref 6–20)
Bilirubin Total: <0.2 mg/dL (ref 0.0–1.2)
CO2: 25 mmol/L (ref 20–29)
CREATININE: 0.77 mg/dL (ref 0.57–1.00)
Calcium: 8.5 mg/dL — ABNORMAL LOW (ref 8.7–10.2)
Chloride: 103 mmol/L (ref 96–106)
GFR calc Af Amer: 116 mL/min/{1.73_m2} (ref >59)
GFR calc non Af Amer: 100 mL/min/{1.73_m2} (ref >59)
GLUCOSE: 135 mg/dL — AB (ref 65–99)
Globulin, Total: 2.5 g/dL (ref 1.5–4.5)
Potassium: 3.4 mmol/L — ABNORMAL LOW (ref 3.5–5.2)
Sodium: 142 mmol/L (ref 134–144)
Total Protein: 6.3 g/dL (ref 6.0–8.5)

## 2018-03-26 LAB — CBC
HEMATOCRIT: 32.9 % — AB (ref 34.0–46.6)
HEMOGLOBIN: 10.6 g/dL — AB (ref 11.1–15.9)
MCH: 25.2 pg — ABNORMAL LOW (ref 26.6–33.0)
MCHC: 32.2 g/dL (ref 31.5–35.7)
MCV: 78 fL — AB (ref 79–97)
Platelets: 321 10*3/uL (ref 150–450)
RBC: 4.21 x10E6/uL (ref 3.77–5.28)
RDW: 15.3 % (ref 12.3–15.4)
WBC: 5.5 10*3/uL (ref 3.4–10.8)

## 2018-03-26 MED ORDER — NIFEDIPINE ER 30 MG PO TB24: 30.0000 mg | ORAL_TABLET | Freq: Every day | ORAL | 1 refills | Status: DC

## 2018-03-26 MED ORDER — IBUPROFEN 600 MG PO TABS: 600.0000 mg | ORAL_TABLET | Freq: Four times a day (QID) | ORAL | 3 refills | Status: AC | PRN

## 2018-03-26 NOTE — Progress Notes (Signed)
Post Partum Exam  Suzanne Little is a 37 y.o. [id number] female who presents for a postpartum visit. She is eight weeks  postpartum following a vaginal delivery. I have fully reviewed the prenatal and intrapartum course. The delivery was at 39  gestational weeks, complicated by A1GDM.  Anesthesia:none. Postpartum course has been complicated by HTN, she was started on Procardia XL. Baby's course has been uncomplicated. Baby is feeding by breast. Bleeding is normal. Bowel function is normal. Bladder function is normal. Patient is sexually active. Contraception method is tubal ligation. Postpartum depression screening:negative. She reports feeling very tired due to lack of sleep, and occasional headaches when she is tired.  The following portions of the patient's history were reviewed and updated as appropriate: allergies, current medications, past family history, past medical history, past social history, past surgical history and problem list. Last pap smear done 08/24/2017 and was normal  Review of Systems Pertinent items noted in HPI and remainder of comprehensive ROS otherwise negative.    Objective:  Blood pressure 138/90, pulse 72, weight 212 lb 8 oz (96.4 kg), currently breastfeeding.  General:  alert and no distress   Breasts:  inspection negative, no nipple discharge or bleeding, no masses or nodularity palpable  Lungs: clear to auscultation bilaterally  Heart:  regular rate and rhythm  Abdomen: soft, non-tender; bowel sounds normal; no masses,  no organomegaly and incision C/D/I   Pelvic:  not evaluated        Assessment and Plan:  1. History of gestational diabetes mellitus - Postpartum Glucose tolerance, 2 hours done today, will follow up results and manage accordingly.  2. Postpartum hypertension Continued hypertension, patient reports not taking medications lately. Procardia refilled, labs checked. Will follow up with PCP ([facility]) in one month. - NIFEdipine  (PROCARDIA-XL/ADALAT CC) 30 MG 24 hr tablet; Take 1 tablet (30 mg total) by mouth daily.  Dispense: 30 tablet; Refill: 1 - Comprehensive metabolic panel - CBC  3. Postpartum care following vaginal delivery Ibuprofen refilled for occasional headaches; told to call for worsening symptoms.  - ibuprofen (ADVIL,MOTRIN) 600 MG tablet; Take 1 tablet (600 mg total) by mouth every 6 (six) hours as needed for headache, moderate pain or cramping.  Dispense: 30 tablet; Refill: 3   Jaynie Collins, MD, FACOG Obstetrician & Gynecologist, [facility] for [employer], [facility]

## 2018-03-27 LAB — GLUCOSE TOLERANCE, 2 HOURS
GLUCOSE FASTING GTT: 71 mg/dL (ref 65–99)
GLUCOSE, 2 HOUR: 127 mg/dL (ref 65–139)

## 2018-03-29 ENCOUNTER — Encounter: Payer: Self-pay | Admitting: Obstetrics & Gynecology

## 2019-09-27 IMAGING — US US MFM OB DETAIL+14 WK
1 series · 13 of 28 positions shown · non-contrast
Comparison: none

[Series 1: us mfm ob detail+14 wk · 13 of 76 slices shown]
[im 3/76]
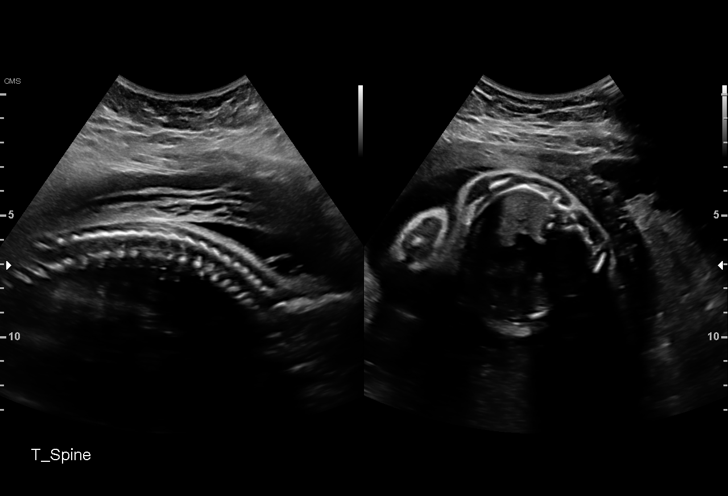
[im 9/76]
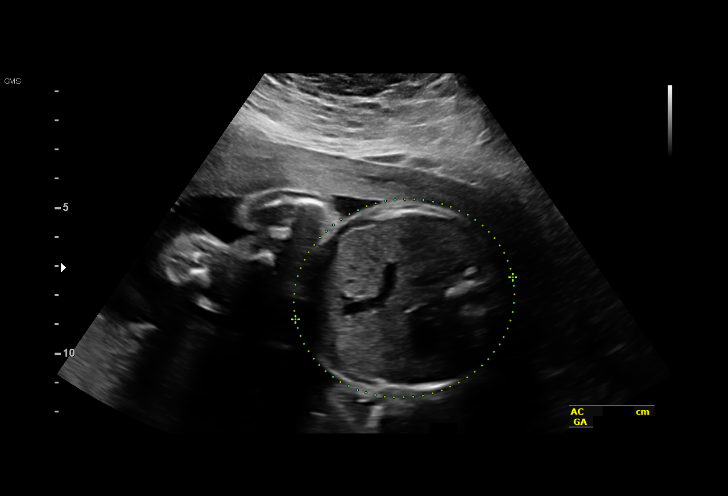
[im 14/76]
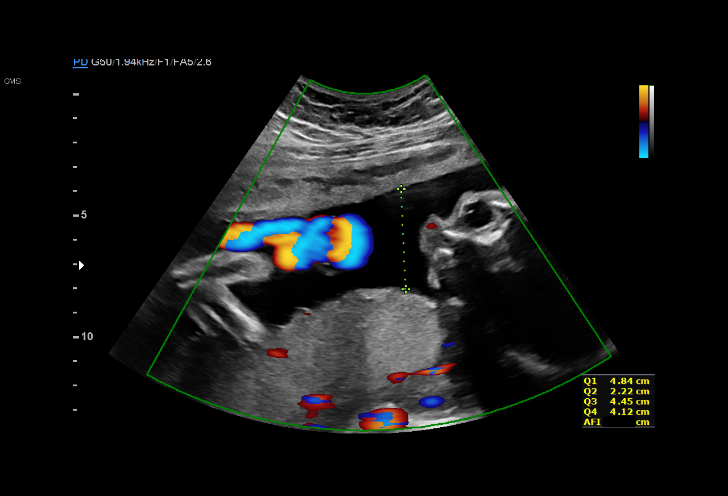
[im 20/76]
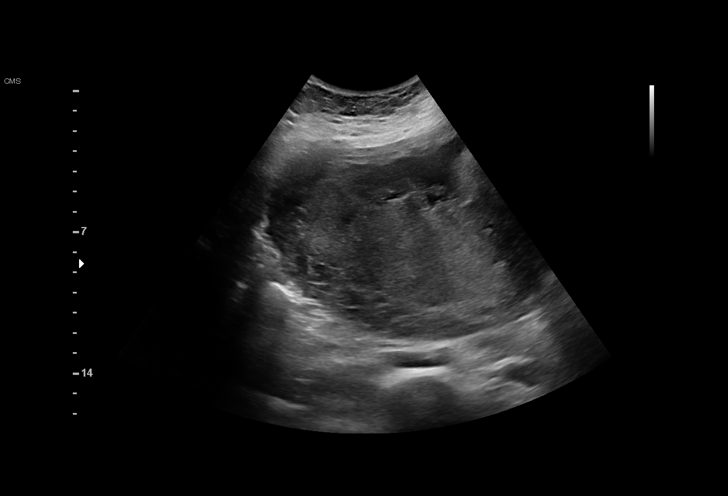
[im 26/76]
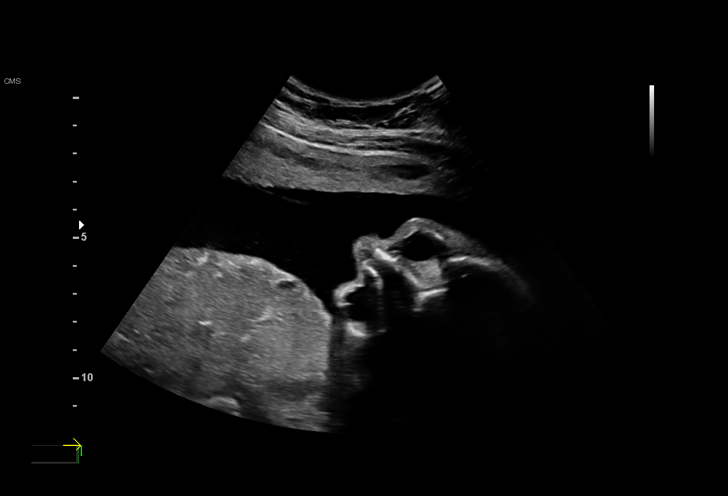
[im 31/76]
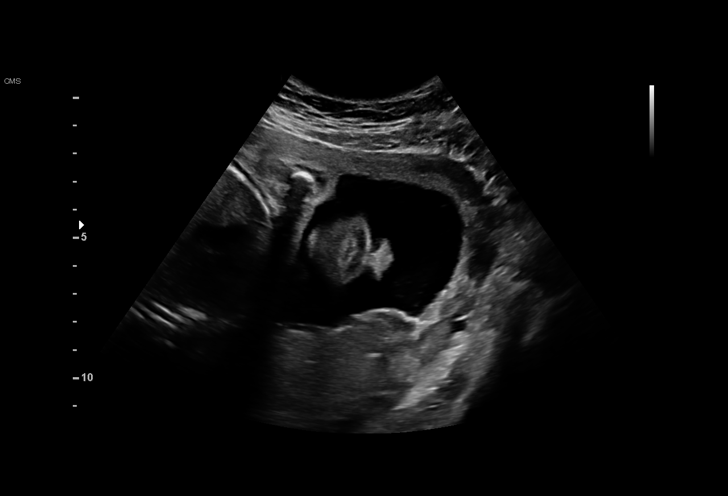
[im 39/76]
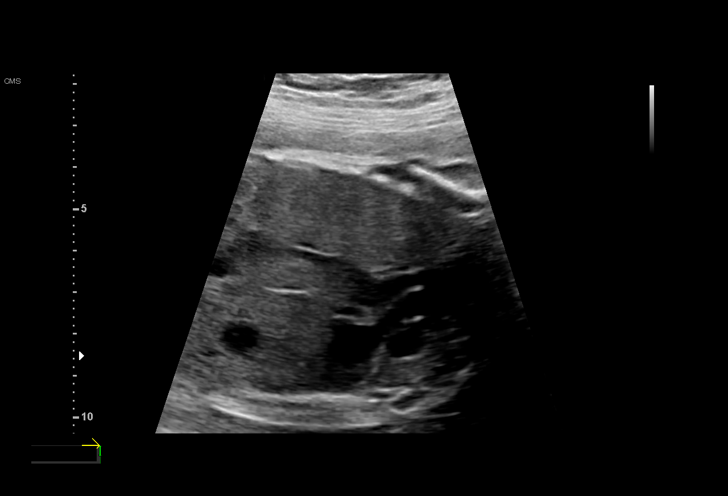
[im 45/76]
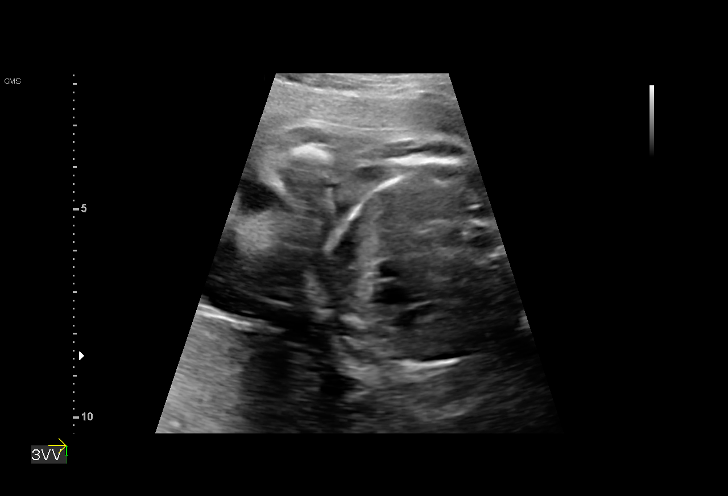
[im 51/76]
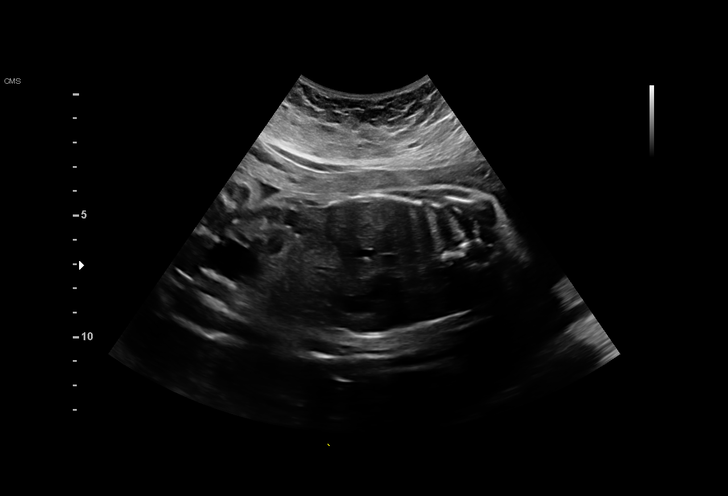
[im 56/76]
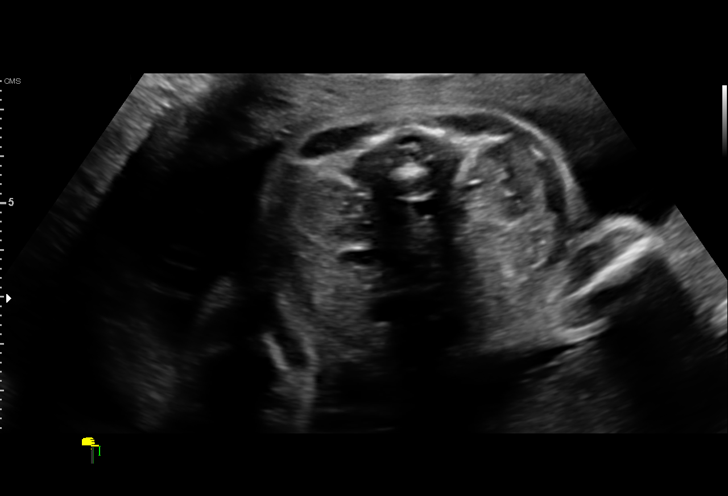
[im 62/76]
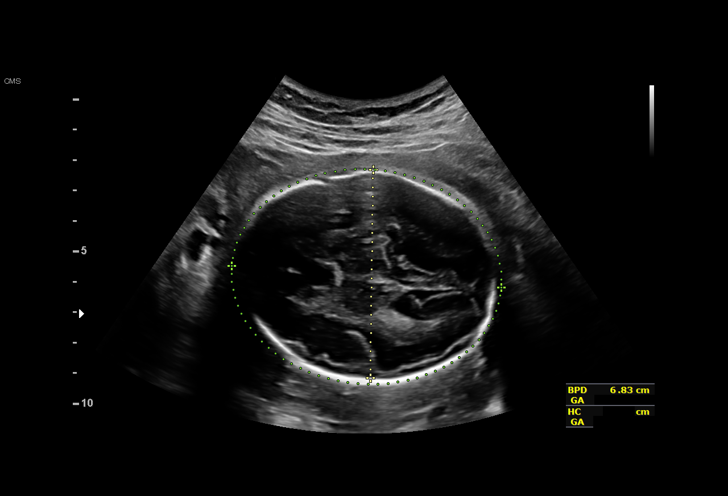
[im 67/76]
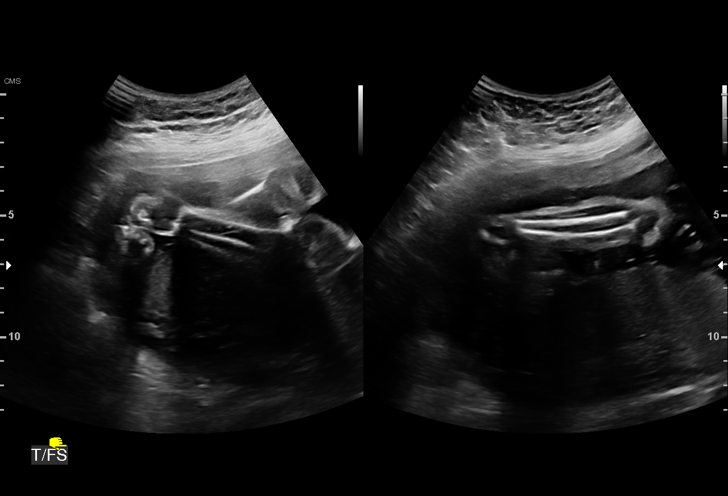
[im 73/76]
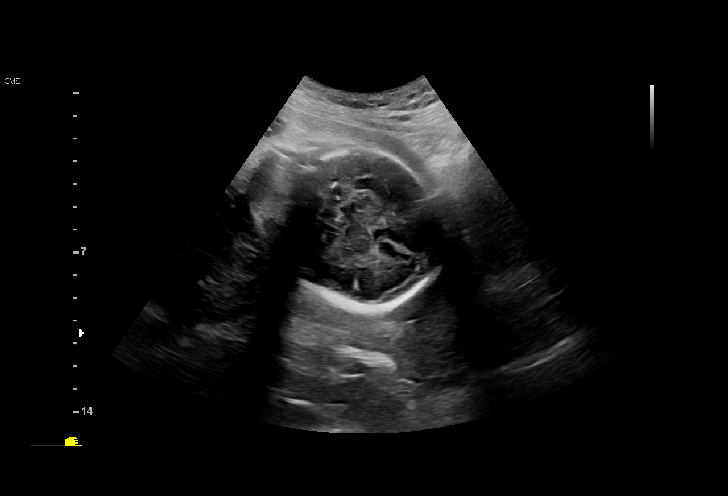

[13 of 28 positions shown; findings below may reference images not displayed]

1  MURIELL BALTAZAR          [id number]       [id number]     [id number]
Indications

27 weeks gestation of pregnancy
Encounter for antenatal screening for
malformations
Encounter for uncertain dates
Advanced maternal age multigravida 35+,
second trimester (declined TURLEY)
Obesity complicating pregnancy, second
trimester
Late prenatal care, second trimester
History of sickle cell trait
Poor obstetric history: Previous gestational
diabetes
OB History

Gravidity:    5         Term:   4        Prem:   0        SAB:   0
TOP:          0       Ectopic:  0        Living: 4
Fetal Evaluation

Num Of Fetuses:     1
Fetal Heart         158
Rate(bpm):
Cardiac Activity:   Observed
Presentation:       Cephalic
Placenta:           Posterior, above cervical os
P. Cord Insertion:  Visualized

Amniotic Fluid
AFI FV:      Subjectively within normal limits

Largest Pocket(cm)
4.84
Biometry

BPD:      67.6  mm     G. Age:  27w 2d         31  %    CI:        72.41   %    70 - 86
FL/HC:      20.5   %    18.6 -
HC:      252.7  mm     G. Age:  27w 3d         23  %    HC/AC:      1.11        1.05 -
AC:      227.4  mm     G. Age:  27w 1d         33  %    FL/BPD:     76.6   %    71 - 87
FL:       51.8  mm     G. Age:  27w 4d         42  %    FL/AC:      22.8   %    20 - 24
HUM:        47  mm     G. Age:  27w 5d         52  %
CER:        32  mm     G. Age:  28w 0d         58  %
CM:       10.3  mm

Est. FW:    4804  gm      2 lb 5 oz     50  %
Gestational Age

LMP:           23w 3d        Date:  05/29/17                 EDD:   03/05/18
U/S Today:     27w 3d                                        EDD:   02/05/18
Best:          27w 3d     Det. By:  U/S (11/09/17)           EDD:   02/05/18
Anatomy

Cranium:               Appears normal         Aortic Arch:            Appears normal
Cavum:                 Appears normal         Ductal Arch:            Appears normal
Ventricles:            Appears normal         Diaphragm:              Appears normal
Choroid Plexus:        Appears normal         Stomach:                Appears normal, left
sided
Cerebellum:            Appears normal         Abdomen:                Appears normal
Posterior Fossa:       Appears normal         Abdominal Wall:         Appears nml (cord
insert, abd wall)
Nuchal Fold:           Not applicable (>20    Cord Vessels:           Appears normal (3
wks GA)                                        vessel cord)
Face:                  Absent nasal bone      Kidneys:                Appear normal
Lips:                  Appears normal         Bladder:                Appears normal
Thoracic:              Appears normal         Spine:                  Appears normal
Heart:                 Not well visualized    Upper Extremities:      Appears normal
RVOT:                  Appears normal         Lower Extremities:      Appears normal
LVOT:                  Appears normal

Other:  Fetus appears to be a male.
Cervix Uterus Adnexa

Cervix
Length:              3  cm.
Normal appearance by transabdominal scan.

Uterus
No abnormality visualized.

Left Ovary
Within normal limits.

Right Ovary
Within normal limits.

Cul De Sac:   No free fluid seen.

Adnexa:       No abnormality visualized.
Impression

Singleton intrauterine pregnancy at 27+3 weeks with AMA
here for anatomic survey
Review of the anatomy shows no sonographic markers for
aneuploidy or structural anomalies
However, views of the fetal heart should be considered
suboptimal secondary to fetal position
Amniotic fluid volume is normal
Estimated fetal weight shows growth in the 50th percentile
Recommendations

Recommend follow-up ultrasound examination in 4 weeks for
completion of the anatomic survey

## 2019-11-22 IMAGING — US US MFM OB FOLLOW-UP
1 series · 14 of 28 positions shown · non-contrast
Comparison: none

[Series 1: us mfm ob follow-up · 44 acquisitions, 14 frames shown]
[im 2/44]
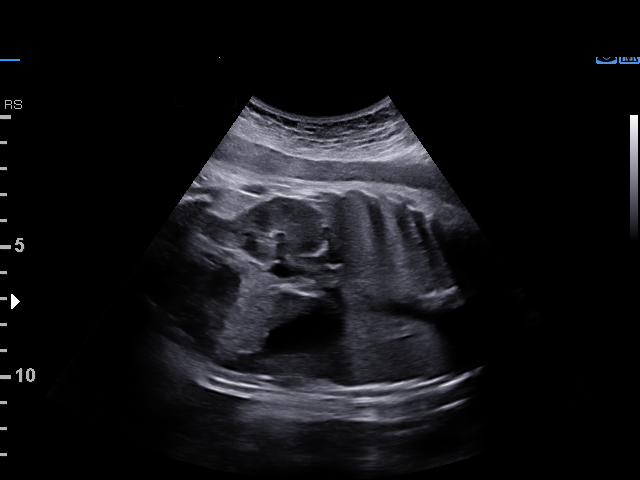
[im 5/44]
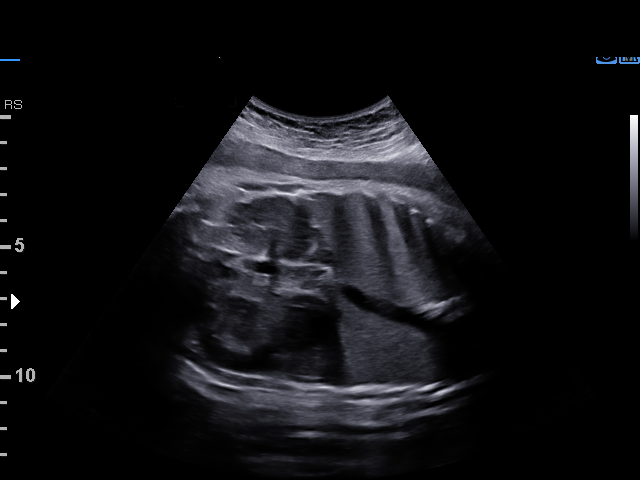
[im 8/44]
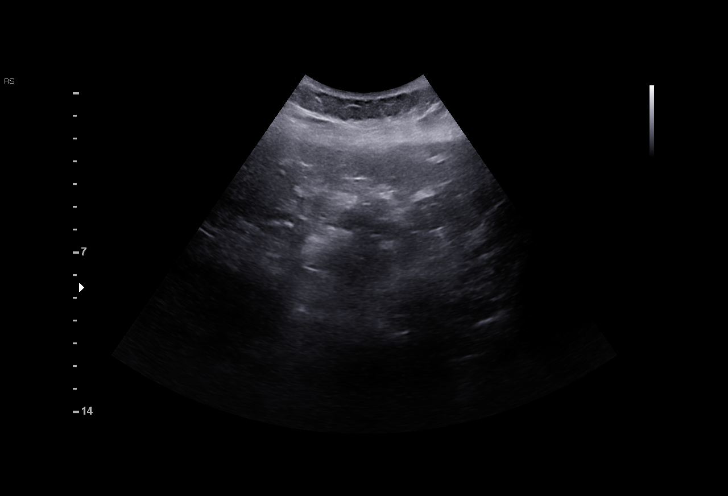
[im 12/44]
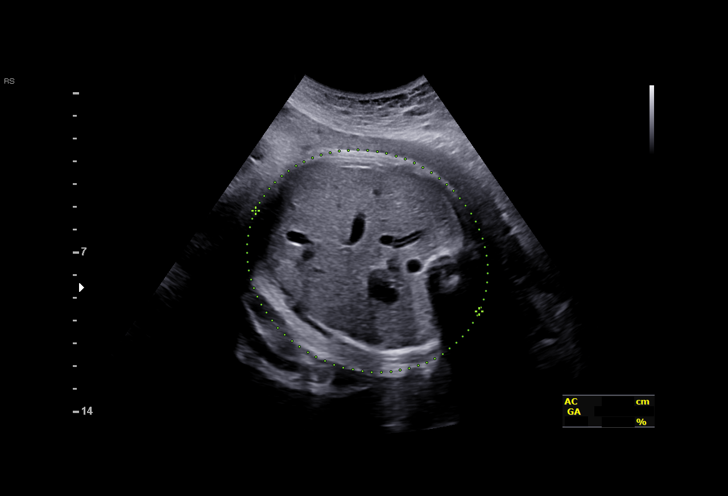
[im 15/44]
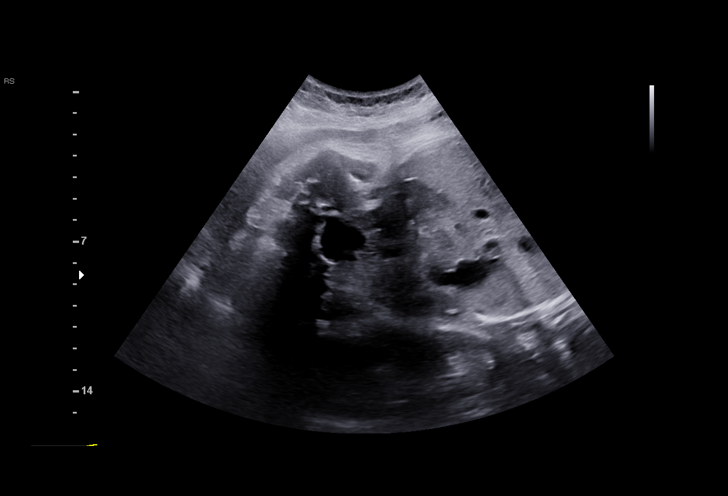
[im 18/44]
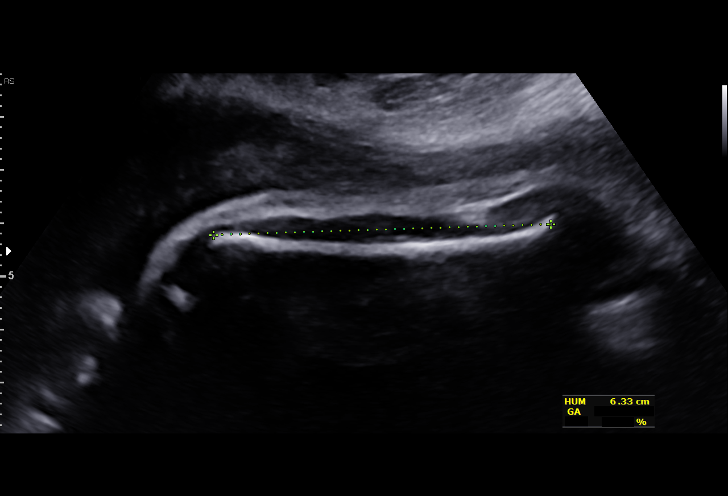
[im 21/44]
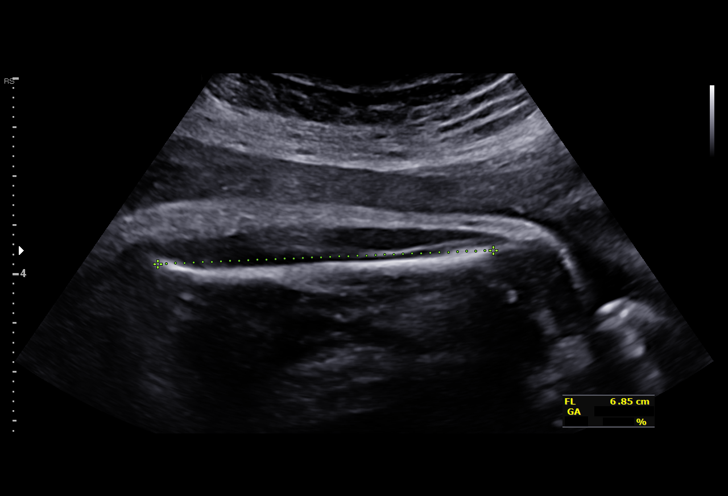
[im 24/44]
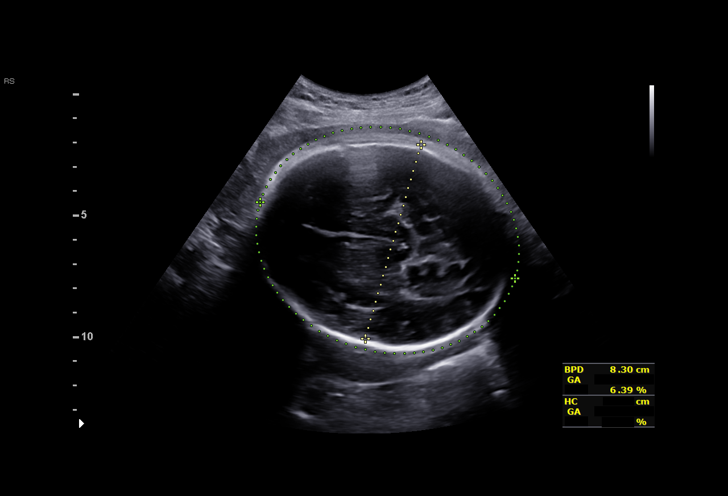
[im 28/44]
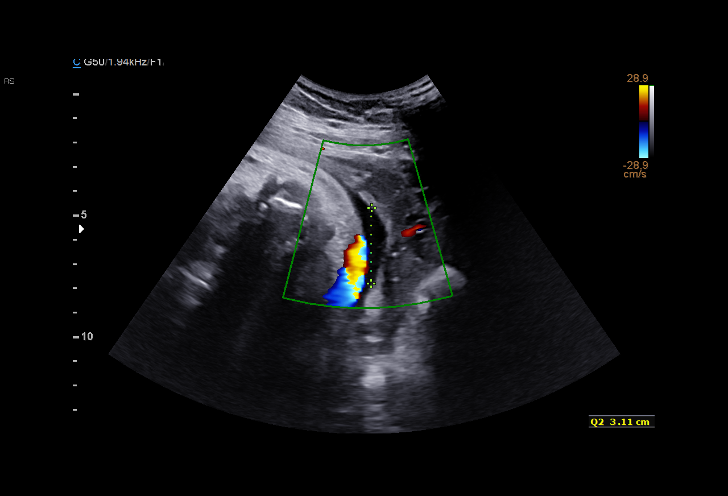
[im 31/44]
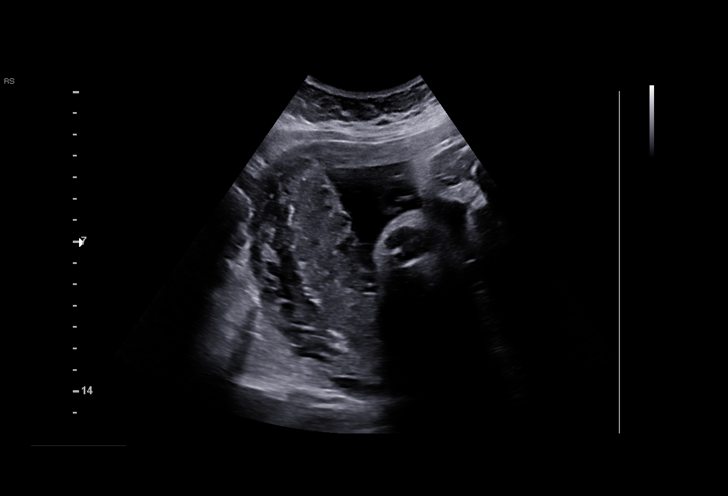
[im 34/44]
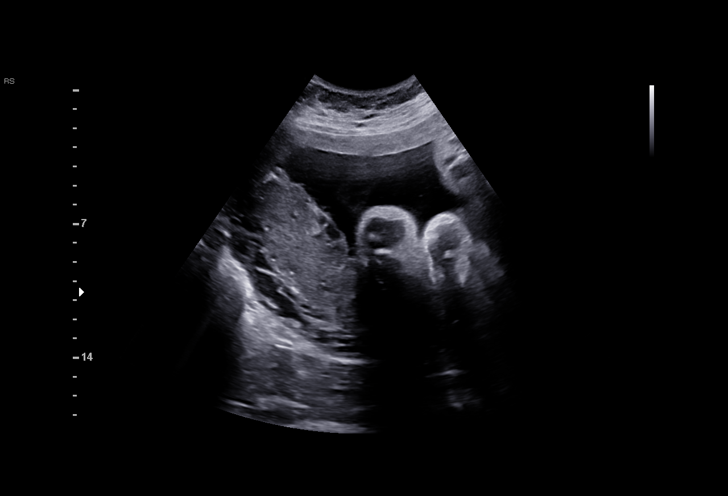
[im 37/44]
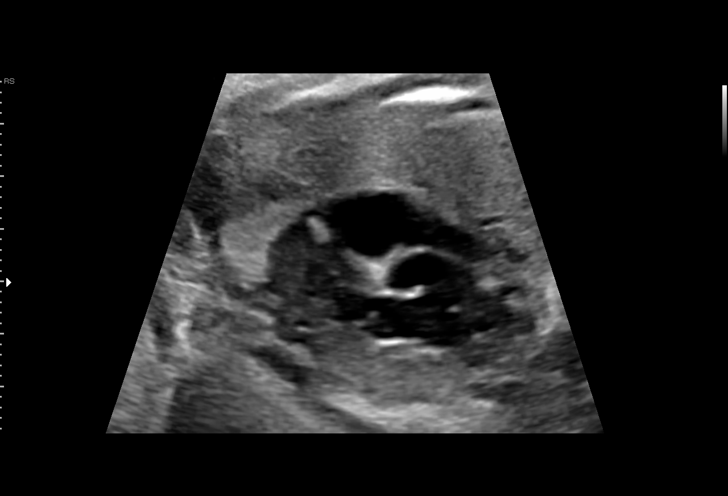
[im 40/44]
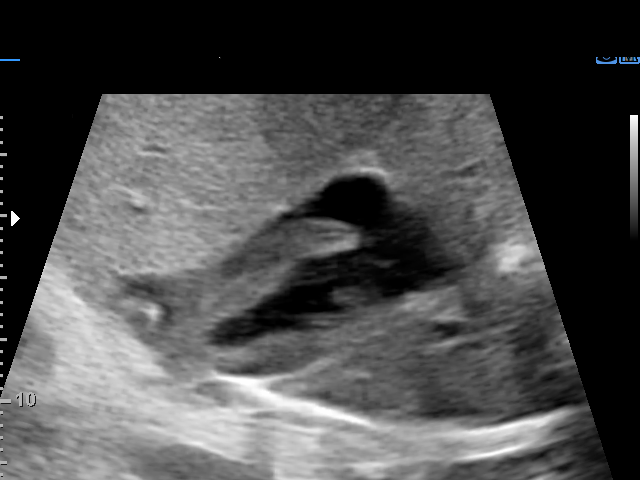
[im 44/44]
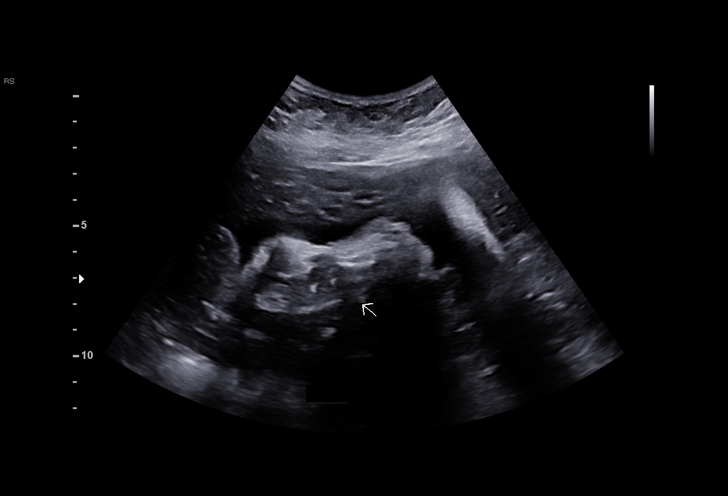

[14 of 28 positions shown; findings below may reference images not displayed]

Indications

35 weeks gestation of pregnancy
Advanced maternal age multigravida 35+,
second trimester (declined DOO-HWAN)
Obesity complicating pregnancy, second
trimester
Late prenatal care, second trimester
History of sickle cell trait
Poor obstetric history: Previous gestational
diabetes
Encounter for other antenatal screening
follow-up
OB History

Blood Type:            Height:  5'5"   Weight (lb):  225       BMI:
Gravidity:    5         Term:   4        Prem:   0        SAB:   0
TOP:          0       Ectopic:  0        Living: 4
Fetal Evaluation

Num Of Fetuses:     1
Fetal Heart         149
Rate(bpm):
Cardiac Activity:   Observed
Presentation:       Cephalic
Placenta:           Posterior, above cervical os
P. Cord Insertion:  Previously Visualized

Amniotic Fluid
AFI FV:      Subjectively within normal limits

AFI Sum(cm)     %Tile       Largest Pocket(cm)
11.08           29
RUQ(cm)       RLQ(cm)       LUQ(cm)        LLQ(cm)
1.92
Biometry

BPD:      83.4  mm     G. Age:  33w 4d         10  %    CI:        70.68   %    70 - 86
FL/HC:      21.8   %    20.1 -
HC:      316.2  mm     G. Age:  35w 4d         21  %    HC/AC:      1.01        0.93 -
AC:      313.1  mm     G. Age:  35w 1d         53  %    FL/BPD:     82.5   %    71 - 87
FL:       68.8  mm     G. Age:  35w 2d         41  %    FL/AC:      22.0   %    20 - 24
HUM:      62.7  mm     G. Age:  36w 3d         82  %

Est. FW:    8664  gm    5 lb 12 oz      56  %
Gestational Age

LMP:           31w 3d        Date:  05/29/17                 EDD:   03/05/18
U/S Today:     34w 6d                                        EDD:   02/09/18
Best:          35w 3d     Det. By:  U/S  (11/09/17)          EDD:   02/05/18
Anatomy

Cranium:               Appears normal         Aortic Arch:            Previously seen
Cavum:                 Previously seen        Ductal Arch:            Previously seen
Ventricles:            Appears normal         Diaphragm:              Previously seen
Choroid Plexus:        Previously seen        Stomach:                Appears normal, left
sided
Cerebellum:            Previously seen        Abdomen:                Appears normal
Posterior Fossa:       Previously seen        Abdominal Wall:         Previously seen
Nuchal Fold:           Not applicable (>20    Cord Vessels:           Previously seen
wks GA)
Face:                  Profile not visualized Kidneys:                Appear normal
Lips:                  Previously seen        Bladder:                Appears normal
Thoracic:              Appears normal         Spine:                  Previously seen
Heart:                 Not well visualized    Upper Extremities:      Previously seen
RVOT:                  Previously seen        Lower Extremities:      Previously seen
LVOT:                  Appears normal

Other:  Fetus appears to be a male. Technically difficult due to fetal position.
Cervix Uterus Adnexa

Cervix
Normal appearance by transabdominal scan.

Uterus
No abnormality visualized.

Left Ovary
Not visualized.

Right Ovary
Not visualized.

Adnexa:       No abnormality visualized. No adnexal mass
visualized.
Impression

Low-risk for Down syndrome on cell-free fetal DNA screening.
Gestational diabetes. Well-controlled on diet.
Fetal growth is appropriate for gestational age. Amniotic fluid
is normal and good fetal activity is seen.
Recommendations

Recommend weekly antenatal testing from 36 weeks till
delivery.

## 2019-11-29 ENCOUNTER — Other Ambulatory Visit: Payer: Medicaid Other

## 2019-11-29 ENCOUNTER — Ambulatory Visit: Payer: Medicaid Other | Attending: Internal Medicine

## 2019-11-29 ENCOUNTER — Other Ambulatory Visit: Payer: Self-pay

## 2019-11-29 DIAGNOSIS — Z20822 Contact with and (suspected) exposure to covid-19: Secondary | ICD-10-CM

## 2019-11-30 LAB — NOVEL CORONAVIRUS, NAA: SARS-CoV-2, NAA: NOT DETECTED

## 2019-11-30 LAB — SARS-COV-2, NAA 2 DAY TAT

## 2022-12-07 ENCOUNTER — Emergency Department (HOSPITAL_COMMUNITY)
Admission: EM | Admit: 2022-12-07 | Discharge: 2022-12-08 | Disposition: A | Discharge status: Home / Self Care | Payer: Self-pay | Attending: Emergency Medicine | Admitting: Emergency Medicine

## 2022-12-07 ENCOUNTER — Emergency Department (HOSPITAL_COMMUNITY): Payer: Medicaid Other

## 2022-12-07 ENCOUNTER — Other Ambulatory Visit: Payer: Self-pay

## 2022-12-07 DIAGNOSIS — S299XXA Unspecified injury of thorax, initial encounter: Secondary | ICD-10-CM | POA: Diagnosis present

## 2022-12-07 DIAGNOSIS — E875 Hyperkalemia: Secondary | ICD-10-CM | POA: Insufficient documentation

## 2022-12-07 DIAGNOSIS — Y92410 Unspecified street and highway as the place of occurrence of the external cause: Secondary | ICD-10-CM | POA: Diagnosis not present

## 2022-12-07 DIAGNOSIS — S2001XA Contusion of right breast, initial encounter: Secondary | ICD-10-CM | POA: Insufficient documentation

## 2022-12-07 DIAGNOSIS — R1030 Lower abdominal pain, unspecified: Secondary | ICD-10-CM | POA: Diagnosis not present

## 2022-12-07 DIAGNOSIS — S40012A Contusion of left shoulder, initial encounter: Secondary | ICD-10-CM | POA: Insufficient documentation

## 2022-12-07 MED ORDER — MORPHINE SULFATE (PF) 4 MG/ML IV SOLN
4.0000 mg | Freq: Once | INTRAVENOUS | Status: AC
  Administered 2022-12-07: 4 mg via INTRAVENOUS
  Filled 2022-12-07: qty 1
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
## 2022-12-08 ENCOUNTER — Emergency Department (HOSPITAL_COMMUNITY): Payer: Medicaid Other

## 2022-12-08 LAB — CBC WITH DIFFERENTIAL/PLATELET
Abs Immature Granulocytes: 0.04 10*3/uL (ref 0.00–0.07)
Basophils Absolute: 0 10*3/uL (ref 0.0–0.1)
Basophils Relative: 1 %
Eosinophils Absolute: 0.1 10*3/uL (ref 0.0–0.5)
Eosinophils Relative: 1 %
HCT: 35.5 % — ABNORMAL LOW (ref 36.0–46.0)
Hemoglobin: 11.3 g/dL — ABNORMAL LOW (ref 12.0–15.0)
Immature Granulocytes: 1 %
Lymphocytes Relative: 24 %
Lymphs Abs: 2 10*3/uL (ref 0.7–4.0)
MCH: 25.5 pg — ABNORMAL LOW (ref 26.0–34.0)
MCHC: 31.8 g/dL (ref 30.0–36.0)
MCV: 80.1 fL (ref 80.0–100.0)
Monocytes Absolute: 0.6 10*3/uL (ref 0.1–1.0)
Monocytes Relative: 7 %
Neutro Abs: 5.8 10*3/uL (ref 1.7–7.7)
Neutrophils Relative %: 66 %
Platelets: 314 10*3/uL (ref 150–400)
RBC: 4.43 MIL/uL (ref 3.87–5.11)
RDW: 14.6 % (ref 11.5–15.5)
WBC: 8.6 10*3/uL (ref 4.0–10.5)
nRBC: 0 % (ref 0.0–0.2)

## 2022-12-08 LAB — COMPREHENSIVE METABOLIC PANEL
ALT: 28 U/L (ref 0–44)
AST: 58 U/L — ABNORMAL HIGH (ref 15–41)
Albumin: 3 g/dL — ABNORMAL LOW (ref 3.5–5.0)
Alkaline Phosphatase: 78 U/L (ref 38–126)
Anion gap: 12 (ref 5–15)
BUN: 10 mg/dL (ref 6–20)
CO2: 21 mmol/L — ABNORMAL LOW (ref 22–32)
Calcium: 8.6 mg/dL — ABNORMAL LOW (ref 8.9–10.3)
Chloride: 100 mmol/L (ref 98–111)
Creatinine, Ser: 0.89 mg/dL (ref 0.44–1.00)
GFR, Estimated: >60 mL/min (ref >60)
Glucose, Bld: 170 mg/dL — ABNORMAL HIGH (ref 70–99)
Potassium: 5.2 mmol/L — ABNORMAL HIGH (ref 3.5–5.1)
Sodium: 133 mmol/L — ABNORMAL LOW (ref 135–145)
Total Bilirubin: 0.3 mg/dL (ref 0.3–1.2)
Total Protein: 6.4 g/dL — ABNORMAL LOW (ref 6.5–8.1)

## 2022-12-08 LAB — I-STAT CHEM 8, ED
BUN: 13 mg/dL (ref 6–20)
Calcium, Ion: 0.88 mmol/L — CL (ref 1.15–1.40)
Chloride: 107 mmol/L (ref 98–111)
Creatinine, Ser: 0.7 mg/dL (ref 0.44–1.00)
Glucose, Bld: 150 mg/dL — ABNORMAL HIGH (ref 70–99)
HCT: 35 % — ABNORMAL LOW (ref 36.0–46.0)
Hemoglobin: 11.9 g/dL — ABNORMAL LOW (ref 12.0–15.0)
Potassium: 7.3 mmol/L (ref 3.5–5.1)
Sodium: 132 mmol/L — ABNORMAL LOW (ref 135–145)
TCO2: 24 mmol/L (ref 22–32)

## 2022-12-08 LAB — I-STAT BETA HCG BLOOD, ED (MC, WL, AP ONLY): I-stat hCG, quantitative: <5 m[IU]/mL (ref <5)

## 2022-12-08 LAB — SAMPLE TO BLOOD BANK

## 2022-12-08 MED ORDER — IOHEXOL 350 MG/ML SOLN
75.0000 mL | Freq: Once | INTRAVENOUS | Status: AC | PRN
  Administered 2022-12-08: 75 mL via INTRAVENOUS

## 2022-12-08 MED ORDER — METHOCARBAMOL 500 MG PO TABS: 500.0000 mg | ORAL_TABLET | Freq: Two times a day (BID) | ORAL | 0 refills | Status: AC

## 2022-12-08 MED ORDER — NAPROXEN 500 MG PO TABS: 500.0000 mg | ORAL_TABLET | Freq: Two times a day (BID) | ORAL | 0 refills | Status: DC

## 2022-12-08 NOTE — Discharge Instructions (Signed)
Your scans today did not show any internal injuries. You will likely be sore for the next few days. Take the prescribed medication as directed. Follow-up with your primary care doctor. Return to the ED for new or worsening symptoms.

## 2022-12-08 NOTE — ED Notes (Signed)
Primary nurse called lab to ensure CMP running, lab states still in process but should result soon for patient to go to CT.

## 2023-06-29 ENCOUNTER — Other Ambulatory Visit: Payer: Self-pay

## 2023-06-29 DIAGNOSIS — N644 Mastodynia: Secondary | ICD-10-CM

## 2023-06-29 NOTE — Addendum Note (Signed)
Addended by: Lucilla Lame E on: 06/29/2023 12:58 PM   Modules accepted: Orders

## 2023-07-16 ENCOUNTER — Ambulatory Visit: Payer: Self-pay | Admitting: *Deleted

## 2023-07-16 ENCOUNTER — Ambulatory Visit
Admission: RE | Admit: 2023-07-16 | Discharge: 2023-07-16 | Disposition: A | Discharge status: Home / Self Care | Payer: No Typology Code available for payment source | Source: Ambulatory Visit | Attending: Obstetrics and Gynecology | Admitting: Obstetrics and Gynecology

## 2023-07-16 ENCOUNTER — Other Ambulatory Visit: Payer: Self-pay

## 2023-07-16 ENCOUNTER — Ambulatory Visit: Admission: RE | Admit: 2023-07-16 | Payer: Medicaid Other | Source: Ambulatory Visit

## 2023-07-16 VITALS — BP 141/100 | Wt 233.0 lb

## 2023-07-16 DIAGNOSIS — N644 Mastodynia: Secondary | ICD-10-CM

## 2023-07-16 DIAGNOSIS — Z1239 Encounter for other screening for malignant neoplasm of breast: Secondary | ICD-10-CM

## 2023-07-16 NOTE — Patient Instructions (Signed)
Explained breast self awareness with [name]. Pap smear is due. Patient has Family Planning Medicaid therefore unable to complete in [facility]. Patient scheduled for Pap smear on Wednesday, September 09, 2023 at 1030 [facility] Scre[facility]. Let her know [facility] will cover Pap smears and HPV typing every 5 years unless has a history of abnormal Pap smears. Referred patient to the [facility] for a diagnostic mammogram. Appointment scheduled Thursday, July 16, 2023 at 1310. Patient aware of appointment and will be there. [name] verbalized understanding.  Peder Allums, Kathaleen Maser, RN 11:40 AM
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
## 2023-09-09 ENCOUNTER — Ambulatory Visit: Payer: Self-pay

## 2023-10-01 ENCOUNTER — Ambulatory Visit: Payer: Self-pay | Admitting: Hematology and Oncology

## 2023-10-01 DIAGNOSIS — Z124 Encounter for screening for malignant neoplasm of cervix: Secondary | ICD-10-CM

## 2023-10-01 NOTE — Progress Notes (Unsigned)
 Patient: Suzanne Little           Date of Birth: 02/02/1981           MRN: [id number] Visit Date: 10/01/2023 PCP: Pcp, No  Cervical Cancer Screening Do you smoke?: No Have you ever had or been told you have an allergy to latex products?: No Marital status: Married Date of last pap smear: 2-5 yrs ago (08/24/17) Date of last menstrual period: 09/02/23 Number of pregnancies: 5 Number of births: 5 Have you ever had any of the following? Hysterectomy: No Tubal ligation (tubes tied): Yes Abnormal bleeding: No Abnormal pap smear: No Venereal warts: No A sex partner with venereal warts: No A high risk* sex partner: No  Cervical Exam Pap smear completed: Pap test Abnormal Observations: Normal  Recommendations: Repeat in five years if normal and HPV-    Patient's History Patient Active Problem List   Diagnosis Date Noted  . History of gestational diabetes mellitus 03/26/2018  . 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 Chronic hepatitis B (HCC)   . GDM (gestational diabetes mellitus) 12/10/2017   Normal postpartum 2 hr GTT on 03/26/18  . Sickle cell trait (HCC) 11/19/2012   Please check the status of the FOB     No family history on file.  Social History   Occupational History  . Not on file  Tobacco Use  . Smoking status: Never  . Smokeless tobacco: Never  Vaping Use  . Vaping status: Never Used  Substance and Sexual Activity  . Alcohol use: No  . Drug use: No  . Sexual activity: Yes    Birth control/protection: Surgical    Comment: 02/03/2018: Tubal ligation

## 2023-10-06 LAB — CYTOLOGY - PAP
Comment: NEGATIVE
Diagnosis: NEGATIVE
High risk HPV: NEGATIVE

## 2023-10-07 NOTE — Progress Notes (Signed)
 Normal pap letter mailed to patient.
